# Patient Record
Sex: Male | Born: 1960
Health system: Southern US, Community
[De-identification: ages and names within clinical notes are randomized; demographics above are authoritative.]

## PROBLEM LIST (undated history)

## (undated) DIAGNOSIS — G709 Myoneural disorder, unspecified: Secondary | ICD-10-CM

## (undated) DIAGNOSIS — L0390 Cellulitis, unspecified: Secondary | ICD-10-CM

## (undated) DIAGNOSIS — G4733 Obstructive sleep apnea (adult) (pediatric): Secondary | ICD-10-CM

## (undated) DIAGNOSIS — F329 Major depressive disorder, single episode, unspecified: Secondary | ICD-10-CM

## (undated) DIAGNOSIS — B182 Chronic viral hepatitis C: Secondary | ICD-10-CM

## (undated) DIAGNOSIS — E1169 Type 2 diabetes mellitus with other specified complication: Secondary | ICD-10-CM

## (undated) DIAGNOSIS — M47812 Spondylosis without myelopathy or radiculopathy, cervical region: Secondary | ICD-10-CM

## (undated) DIAGNOSIS — J449 Chronic obstructive pulmonary disease, unspecified: Secondary | ICD-10-CM

## (undated) DIAGNOSIS — M47816 Spondylosis without myelopathy or radiculopathy, lumbar region: Secondary | ICD-10-CM

## (undated) DIAGNOSIS — I10 Essential (primary) hypertension: Secondary | ICD-10-CM

## (undated) DIAGNOSIS — Z860100 Personal history of colon polyps, unspecified: Secondary | ICD-10-CM

## (undated) DIAGNOSIS — R609 Edema, unspecified: Secondary | ICD-10-CM

## (undated) DIAGNOSIS — F909 Attention-deficit hyperactivity disorder, unspecified type: Secondary | ICD-10-CM

## (undated) DIAGNOSIS — Z8719 Personal history of other diseases of the digestive system: Secondary | ICD-10-CM

## (undated) DIAGNOSIS — F32A Depression, unspecified: Secondary | ICD-10-CM

## (undated) DIAGNOSIS — E230 Hypopituitarism: Secondary | ICD-10-CM

## (undated) DIAGNOSIS — G909 Disorder of the autonomic nervous system, unspecified: Secondary | ICD-10-CM

## (undated) DIAGNOSIS — G5601 Carpal tunnel syndrome, right upper limb: Secondary | ICD-10-CM

## (undated) DIAGNOSIS — E1140 Type 2 diabetes mellitus with diabetic neuropathy, unspecified: Secondary | ICD-10-CM

## (undated) DIAGNOSIS — F419 Anxiety disorder, unspecified: Secondary | ICD-10-CM

## (undated) DIAGNOSIS — K219 Gastro-esophageal reflux disease without esophagitis: Secondary | ICD-10-CM

## (undated) DIAGNOSIS — M1990 Unspecified osteoarthritis, unspecified site: Secondary | ICD-10-CM

## (undated) DIAGNOSIS — I878 Other specified disorders of veins: Secondary | ICD-10-CM

## (undated) DIAGNOSIS — G894 Chronic pain syndrome: Secondary | ICD-10-CM

## (undated) DIAGNOSIS — R600 Localized edema: Secondary | ICD-10-CM

## (undated) HISTORY — DX: Depression, unspecified: F32.A

## (undated) HISTORY — DX: Edema, unspecified: R60.9

## (undated) HISTORY — DX: Gastro-esophageal reflux disease without esophagitis: K21.9

## (undated) HISTORY — DX: Hypopituitarism: E23.0

## (undated) HISTORY — DX: Obstructive sleep apnea (adult) (pediatric): G47.33

## (undated) HISTORY — DX: Chronic pain syndrome: G89.4

## (undated) HISTORY — DX: Personal history of colonic polyps: Z86.010

## (undated) HISTORY — DX: Essential (primary) hypertension: I10

## (undated) HISTORY — DX: Other specified disorders of veins: I87.8

## (undated) HISTORY — DX: Attention-deficit hyperactivity disorder, unspecified type: F90.9

## (undated) HISTORY — DX: Type 2 diabetes mellitus with diabetic neuropathy, unspecified: E11.40

## (undated) HISTORY — DX: Carpal tunnel syndrome, right upper limb: G56.01

## (undated) HISTORY — DX: Cellulitis, unspecified: L03.90

## (undated) HISTORY — PX: UMBILICAL HERNIA REPAIR: SHX196

## (undated) HISTORY — DX: Hyperlipidemia, unspecified: E11.69

## (undated) HISTORY — PX: POSTERIOR LUMBAR FUSION: SHX6036

## (undated) HISTORY — PX: APPENDECTOMY: SHX54

## (undated) HISTORY — DX: Anxiety disorder, unspecified: F41.9

## (undated) HISTORY — DX: Localized edema: R60.0

## (undated) HISTORY — PX: SMALL INTESTINE SURGERY: SHX150

## (undated) HISTORY — DX: Chronic viral hepatitis C: B18.2

## (undated) HISTORY — DX: Personal history of colon polyps, unspecified: Z86.0100

## (undated) HISTORY — DX: Spondylosis without myelopathy or radiculopathy, lumbar region: M47.816

## (undated) HISTORY — DX: Personal history of other diseases of the digestive system: Z87.19

## (undated) HISTORY — DX: Myoneural disorder, unspecified: G70.9

## (undated) HISTORY — PX: OTHER SURGICAL HISTORY: SHX169

## (undated) HISTORY — DX: Chronic obstructive pulmonary disease, unspecified: J44.9

## (undated) HISTORY — DX: Disorder of the autonomic nervous system, unspecified: G90.9

## (undated) HISTORY — DX: Unspecified osteoarthritis, unspecified site: M19.90

## (undated) HISTORY — DX: Spondylosis without myelopathy or radiculopathy, cervical region: M47.812

---
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
## 2015-03-01 HISTORY — PX: OTHER SURGICAL HISTORY: SHX169

## 2016-03-04 DIAGNOSIS — B182 Chronic viral hepatitis C: Secondary | ICD-10-CM | POA: Insufficient documentation

## 2016-03-04 DIAGNOSIS — Z8719 Personal history of other diseases of the digestive system: Secondary | ICD-10-CM | POA: Insufficient documentation

## 2016-03-04 DIAGNOSIS — G909 Disorder of the autonomic nervous system, unspecified: Secondary | ICD-10-CM | POA: Insufficient documentation

## 2016-03-04 DIAGNOSIS — F17200 Nicotine dependence, unspecified, uncomplicated: Secondary | ICD-10-CM | POA: Insufficient documentation

## 2016-03-04 DIAGNOSIS — J449 Chronic obstructive pulmonary disease, unspecified: Secondary | ICD-10-CM | POA: Insufficient documentation

## 2016-03-04 DIAGNOSIS — I87302 Chronic venous hypertension (idiopathic) without complications of left lower extremity: Secondary | ICD-10-CM | POA: Insufficient documentation

## 2016-03-04 DIAGNOSIS — Z981 Arthrodesis status: Secondary | ICD-10-CM | POA: Insufficient documentation

## 2016-04-27 DIAGNOSIS — E559 Vitamin D deficiency, unspecified: Secondary | ICD-10-CM | POA: Insufficient documentation

## 2016-07-19 DIAGNOSIS — E1169 Type 2 diabetes mellitus with other specified complication: Secondary | ICD-10-CM | POA: Insufficient documentation

## 2016-07-19 DIAGNOSIS — E669 Obesity, unspecified: Secondary | ICD-10-CM | POA: Insufficient documentation

## 2016-08-11 DIAGNOSIS — Z860100 Personal history of colon polyps, unspecified: Secondary | ICD-10-CM | POA: Insufficient documentation

## 2016-08-11 DIAGNOSIS — K5730 Diverticulosis of large intestine without perforation or abscess without bleeding: Secondary | ICD-10-CM | POA: Insufficient documentation

## 2016-08-11 DIAGNOSIS — K429 Umbilical hernia without obstruction or gangrene: Secondary | ICD-10-CM | POA: Insufficient documentation

## 2016-08-11 DIAGNOSIS — I700 Atherosclerosis of aorta: Secondary | ICD-10-CM | POA: Insufficient documentation

## 2016-10-19 DIAGNOSIS — M47816 Spondylosis without myelopathy or radiculopathy, lumbar region: Secondary | ICD-10-CM | POA: Insufficient documentation

## 2017-02-01 DIAGNOSIS — M47812 Spondylosis without myelopathy or radiculopathy, cervical region: Secondary | ICD-10-CM | POA: Insufficient documentation

## 2017-04-03 DIAGNOSIS — E230 Hypopituitarism: Secondary | ICD-10-CM | POA: Insufficient documentation

## 2017-07-19 DIAGNOSIS — Z79891 Long term (current) use of opiate analgesic: Secondary | ICD-10-CM | POA: Insufficient documentation

## 2018-03-28 DIAGNOSIS — M5416 Radiculopathy, lumbar region: Secondary | ICD-10-CM | POA: Insufficient documentation

## 2018-03-28 DIAGNOSIS — M5126 Other intervertebral disc displacement, lumbar region: Secondary | ICD-10-CM | POA: Insufficient documentation

## 2018-03-28 DIAGNOSIS — M5127 Other intervertebral disc displacement, lumbosacral region: Secondary | ICD-10-CM | POA: Insufficient documentation

## 2019-01-21 ENCOUNTER — Ambulatory Visit: Payer: Self-pay

## 2019-01-23 ENCOUNTER — Ambulatory Visit: Payer: Medicare Other | Admitting: Physical Medicine and Rehabilitation

## 2019-01-23 ENCOUNTER — Encounter: Payer: Self-pay | Admitting: Physical Medicine and Rehabilitation

## 2019-01-23 ENCOUNTER — Other Ambulatory Visit: Payer: Self-pay

## 2019-01-23 VITALS — BP 144/87 | HR 64

## 2019-01-23 DIAGNOSIS — M546 Pain in thoracic spine: Secondary | ICD-10-CM

## 2019-01-23 DIAGNOSIS — M961 Postlaminectomy syndrome, not elsewhere classified: Secondary | ICD-10-CM

## 2019-01-23 DIAGNOSIS — L03119 Cellulitis of unspecified part of limb: Secondary | ICD-10-CM

## 2019-01-23 DIAGNOSIS — R519 Headache, unspecified: Secondary | ICD-10-CM

## 2019-01-23 DIAGNOSIS — E119 Type 2 diabetes mellitus without complications: Secondary | ICD-10-CM

## 2019-01-23 DIAGNOSIS — G894 Chronic pain syndrome: Secondary | ICD-10-CM | POA: Diagnosis not present

## 2019-01-23 DIAGNOSIS — I878 Other specified disorders of veins: Secondary | ICD-10-CM

## 2019-01-23 DIAGNOSIS — M542 Cervicalgia: Secondary | ICD-10-CM | POA: Diagnosis not present

## 2019-01-23 DIAGNOSIS — G8929 Other chronic pain: Secondary | ICD-10-CM

## 2019-01-23 DIAGNOSIS — M7918 Myalgia, other site: Secondary | ICD-10-CM

## 2019-01-23 DIAGNOSIS — I10 Essential (primary) hypertension: Secondary | ICD-10-CM

## 2019-01-23 MED ORDER — PREGABALIN 150 MG PO CAPS: 150.0000 mg | ORAL_CAPSULE | Freq: Two times a day (BID) | ORAL | 1 refills | Status: DC

## 2019-01-23 MED ORDER — CYCLOBENZAPRINE HCL 10 MG PO TABS: 10.0000 mg | ORAL_TABLET | Freq: Three times a day (TID) | ORAL | 2 refills | Status: DC | PRN

## 2019-01-23 NOTE — Progress Notes (Signed)
  Numeric Pain Rating Scale and Functional Assessment Average Pain 9 Pain Right Now 9 My pain is constant, sharp and aching Pain is worse with: sitting and lying in bed, lifting Pain improves with: medication   In the last MONTH (on 0-10 scale) has pain interfered with the following?  1. General activity like being  able to carry out your everyday physical activities such as walking, climbing stairs, carrying groceries, or moving a chair?  Rating(10)  2. Relation with others like being able to carry out your usual social activities and roles such as  activities at home, at work and in your community. Rating(10)  3. Enjoyment of life such that you have  been bothered by emotional problems such as feeling anxious, depressed or irritable?  Rating(10)

## 2019-01-28 ENCOUNTER — Ambulatory Visit (INDEPENDENT_AMBULATORY_CARE_PROVIDER_SITE_OTHER): Payer: Medicare Other | Admitting: Family Medicine

## 2019-01-28 DIAGNOSIS — I10 Essential (primary) hypertension: Secondary | ICD-10-CM | POA: Insufficient documentation

## 2019-01-28 DIAGNOSIS — I878 Other specified disorders of veins: Secondary | ICD-10-CM | POA: Insufficient documentation

## 2019-01-28 DIAGNOSIS — G894 Chronic pain syndrome: Secondary | ICD-10-CM | POA: Diagnosis not present

## 2019-01-28 DIAGNOSIS — L0390 Cellulitis, unspecified: Secondary | ICD-10-CM | POA: Insufficient documentation

## 2019-01-28 DIAGNOSIS — F339 Major depressive disorder, recurrent, unspecified: Secondary | ICD-10-CM | POA: Diagnosis not present

## 2019-01-28 DIAGNOSIS — Z79899 Other long term (current) drug therapy: Secondary | ICD-10-CM

## 2019-01-28 DIAGNOSIS — R519 Headache, unspecified: Secondary | ICD-10-CM | POA: Insufficient documentation

## 2019-01-28 DIAGNOSIS — M961 Postlaminectomy syndrome, not elsewhere classified: Secondary | ICD-10-CM | POA: Insufficient documentation

## 2019-01-28 DIAGNOSIS — M170 Bilateral primary osteoarthritis of knee: Secondary | ICD-10-CM | POA: Diagnosis not present

## 2019-01-28 DIAGNOSIS — M7918 Myalgia, other site: Secondary | ICD-10-CM | POA: Insufficient documentation

## 2019-01-28 DIAGNOSIS — E119 Type 2 diabetes mellitus without complications: Secondary | ICD-10-CM | POA: Diagnosis not present

## 2019-01-28 MED ORDER — VALSARTAN-HYDROCHLOROTHIAZIDE 160-25 MG PO TABS: 1.0000 | ORAL_TABLET | Freq: Every day | ORAL | 4 refills | Status: DC

## 2019-01-28 MED ORDER — METFORMIN HCL 500 MG PO TABS: 500.0000 mg | ORAL_TABLET | Freq: Two times a day (BID) | ORAL | 4 refills | Status: DC

## 2019-01-28 MED ORDER — BUSPIRONE HCL 10 MG PO TABS: 20.0000 mg | ORAL_TABLET | Freq: Two times a day (BID) | ORAL | 4 refills | Status: DC

## 2019-01-28 MED ORDER — SERTRALINE HCL 100 MG PO TABS: 100.0000 mg | ORAL_TABLET | Freq: Every day | ORAL | 4 refills | Status: DC

## 2019-01-28 NOTE — Progress Notes (Signed)
Virtual Visit via Telephone Note  I connected with Aaron Giles on 01/28/19 at  8:50 AM EST by telephone and verified that I am speaking with the correct person using two identifiers.   I discussed the limitations, risks, security and privacy concerns of performing an evaluation and management service by telephone and the availability of in person appointments. I also discussed with the patient that there may be a patient responsible charge related to this service. The patient expressed understanding and agreed to proceed.  Patient Location: Home Provider Location: [facility] Others participating in call: none   History of Present Illness:      58 yo male new to the practice.  He reports that he moved to the [place] area from [place] about 6 months ago and has not established with a primary care provider.  He reports that last week he saw an orthopedic doctor, Dr. Alvester MorinNewton regarding his chronic neck and back pain and patient additionally reports history of bilateral knee pain for which he has had injections in the past.  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  He reports a fracture at T9 as well as subsequent need to have lumbar fusion surgery.  He has had chronic pain since his fall.  He states that he is currently on disability secondary to his chronic pain issues.  Lumbar fusion surgery was done in 2012.  He received prescriptions for Lyrica and Flexeril from Dr. Alvester MorinNewton but states that Dr. Alvester MorinNewton suggested that patient be referred to pain management for ongoing treatment.  Patient states that he was established with pain management in [place] and in the past was on hydrocodone 10 mg 3 times daily for control of pain.  He currently takes over-the-counter Tylenol or ibuprofen but he  states that this does not touch his pain.  He chronically has pain between an 8-9 in his mid back/thoracic area as well as in the lower back.  Pain in his back is a burning and he has radiation of pain down his legs.  He also reports neck pain that is also around 8-9 with occasional radiation of pain into the shoulder blade areas.        He also reports a history of type 1 diabetes but states that this was only diagnosed a few years ago and he is only on metformin for control of his blood sugars. (Most likely this is actually type 2 diabetes).  He denies any current issues with increased thirst, blurred vision or urinary frequency related to his diabetes.  He reports that he has not yet unpacked his glucometer therefore he has been unable to check his blood sugars.  He also has hypertension for which he takes valsartan-hydrochlorothiazide.  He has not been checking his blood pressures.  He denies headaches or dizziness related to his blood pressure but does get headaches at the base of the scalp due to his chronic neck pain.  He also reports a history of venous stasis in his lower extremities and has had past hospitalization for cellulitis.  He denies any current cellulitis but does have some mild, chronic lower extremity swelling.  He reports chronic depression which is currently controlled on BuSpar for which he reports being on 20 mg twice daily as well  as 100 mg daily of Zoloft.  No suicidal thoughts or ideations, no intent for self-harm.  He also reports bilateral knee pain for which he is received injections in the past.  He does not currently use any assistive devices for ambulation but has used a cane in the past.   Past Medical History:  Diagnosis Date  . Arthritis   . Carpal tunnel syndrome on right   . Cellulitis   . Diabetes mellitus without complication ([place])   . Hypertension   . Venous stasis     Past Surgical History:  Procedure Laterality Date  . POSTERIOR LUMBAR FUSION     L3-4  fusion    No family history on file.  Social History   Tobacco Use  . Smoking status: Current Every Day Smoker    Packs/day: 0.50  . Smokeless tobacco: Never Used  Substance Use Topics  . Alcohol use: Not Currently  . Drug use: Not on file     No Known Allergies     Observations/Objective: No vital signs or physical exam conducted as visit was done via telephone  Assessment and Plan: 1. Type 2 diabetes mellitus without complication, without long-term current use of insulin (HCC) On review of chart, there are no prior lab values available.  He has been asked to come into the office to have hemoglobin A1c as he states that he has not had this done since moving here to [place] 6 months ago.  He will also have comprehensive metabolic panel and lipid panel as well as urine microalbumin in follow-up of his diabetes.  He reports that his blood sugars are currently controlled on his Metformin.  He will be contacted regarding any needed changes in medication based on his upcoming lab information.  He is encouraged to continue low carbohydrate diet as well as exercise as tolerated. - metFORMIN (GLUCOPHAGE) 500 MG tablet; Take 1 tablet (500 mg total) by mouth 2 (two) times daily with a meal.  Dispense: 60 tablet; Refill: 4 - Comprehensive metabolic panel; Future - Lipid Panel; Future - Hemoglobin A1c; Future  2. Essential hypertension He reports his blood pressure has been controlled on his current valsartan-hydrochlorothiazide and refill provided at today's visit.  He has been asked to have nurse visit for blood pressure recheck when he comes in for his lab visit. - valsartan-hydrochlorothiazide (DIOVAN-HCT) 160-25 MG tablet; Take 1 tablet by mouth daily. To lower blood pressure  Dispense: 30 tablet; Refill: 4 - Lipid Panel; Future  3. Chronic pain syndrome 4. Post laminectomy syndrome Recent note from patient's appointment with Dr. Ernestina Patches on January 23, 2019 reviewed and Dr. Ernestina Patches  does recommend referral to pain management clinic.  Referral will be placed for ongoing treatment of patient's chronic issues with back pain and neck pain.  Patient reports that he does have a disc containing his past medical records. - Ambulatory referral to Pain Clinic  5. Major depression, recurrent, chronic (HCC)-controlled; currently mild Patient reports history of major depression related to his chronic pain/chronic injuries.  He reports that his symptoms are currently controlled with the use of BuSpar and Zoloft and medications were refilled at today's visit. - busPIRone (BUSPAR) 10 MG tablet; Take 2 tablets (20 mg total) by mouth 2 (two) times daily.  Dispense: 120 tablet; Refill: 4 - sertraline (ZOLOFT) 100 MG tablet; Take 1 tablet (100 mg total) by mouth daily.  Dispense: 30 tablet; Refill: 4 - Comprehensive metabolic panel; Future  6. Long-term use of high-risk medication Patient  with long-term use of high-risk medications for treatment of chronic pain issues, hypertension, diabetes and depression.  Patient will come into the office tomorrow to have comprehensive metabolic panel. - Comprehensive metabolic panel; Future  7. Osteoarthritis of both knees, unspecified osteoarthritis type Discussed with patient that he can contact the orthopedic office where he was recently seen in order to obtain follow-up of his osteoarthritis and injections can also be given if needed by pain medicine to which he has been referred.  Follow Up Instructions:Return in about 4 months (around 05/28/2019) for Chronic issues; lab visit and blood pressure recheck in 1 to 2 weeks.    I discussed the assessment and treatment plan with the patient. The patient was provided an opportunity to ask questions and all were answered. The patient agreed with the plan and demonstrated an understanding of the instructions.   The patient was advised to call back or seek an in-person evaluation if the symptoms worsen or if the  condition fails to improve as anticipated.  I provided 24 minutes of non-face-to-face time during this encounter.   Cain Saupe, MD

## 2019-01-28 NOTE — Progress Notes (Signed)
Aaron Giles - 58 y.o. male MRN [id number]  Date of birth: [birth year]  Office Visit Note: Visit Date: 01/23/2019 PCP: Herby Abraham, MD Referred by: No ref. provider found  Subjective: Chief Complaint  Patient presents with   Neck - Pain   Middle Back - Pain   Lower Back - Pain   Right Leg - Pain   HPI: Aaron Giles is a 58 y.o. male who comes in today As a self referral for evaluation and management of neck and back pain and leg pain status post lumbar fusion and chronic pain syndrome with a history of medication management with long-term opioid.  He reports moving to [place] 5 to 6 months ago from [place] where he was treated through the [facility] system.  He did bring a CD today with over 500 pages of notes with the last set of notes ending last year.  I did review most of the notes including his history of spine surgery and pain management.  When asked about his history he gives a rather complicated history focusing on cause-and-effect and injury.  Essentially he feels like this all stemmed from an incident where he had issues with the T9 vertebral body that he reports is a fracture.  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  He is having pain from this generalized thoracic area referring into the neck and causing headaches in general.  This is his main complaint at this point.  This pain is still been going on now for several years.  None of his pain is acute it is all chronic.  He does get pain from the shoulder blade areas radiating in a nondermatomal fashion and more of a myotomal fashion up through the trapezius and occiput.  He describes occipital headache but without any characteristics of occipital neuralgia.  He has not seen a headache specialist.  He has not reported any new focal weakness or bowel or bladder issues or red flag complaints.  His second biggest complaint  today is trying to figure out how he can be restarted on a comprehensive medication approach to his pain which has been successful according to him.  In terms of his other complaints today he reports constant sharp aching low back pain worse with sitting but does radiate into the right leg.  He reports lumbar fusion at L3-4 but left him with permanent nerve damage.  The surgery was performed in 2012.  He reports paresthesias in the right leg more of an L5 distribution.  Classic pain down to the big toe.  He reports multiple interventions with injections with no relief.  He also reports that spinal cord stimulator was presented as an option but he does not want any devices.  He does not want any more surgery.  Even though he talks about permanent nerve damage she has not had electrodiagnostic study that he is aware of and I did describe this to him.  I did not see this in the 500 pages of notes.  His pain is worse with prolonged sitting but also lying in bed.  He is reported for the last 4 years she has slept in a chair.  His course is complicated by morbid obesity, type 2 diabetes as well as an every day smoking history.  He also reports chronic venous stasis and cellulitis.  He has not established a primary care physician here  as of yet.  He tells me that he has an appointment with Dr. Bing Quarry coming up.  Medications that he is used for his pain including Flexeril and Lyrica.  The Lyrica was 150 mg twice a day.  He also used hydrocodone.  I did check the controlled substance reporting and the last hydrocodone prescription was in May of last year.  He was receiving 84 tablets/month of hydrocodone 10 mg.  Prior to that he received oxycodone.  He was receiving these part of a comprehensive pain management program and at least according to their notes had no aberrant usage.  Review of Systems  Constitutional: Negative for chills, fever, malaise/fatigue and weight loss.  HENT: Negative for hearing loss and  sinus pain.   Eyes: Negative for blurred vision, double vision and photophobia.  Respiratory: Negative for cough and shortness of breath.   Cardiovascular: Negative for chest pain, palpitations and leg swelling.  Gastrointestinal: Negative for abdominal pain, nausea and vomiting.  Genitourinary: Negative for flank pain.  Musculoskeletal: Positive for back pain, joint pain and neck pain. Negative for myalgias.  Skin: Negative for itching and rash.  Neurological: Positive for tingling, focal weakness and weakness. Negative for tremors.  Endo/Heme/Allergies: Negative.   Psychiatric/Behavioral: Negative for depression. The patient is nervous/anxious.   All other systems reviewed and are negative.  Otherwise per HPI.  Assessment & Plan: Visit Diagnoses:  1. Chronic pain syndrome   2. Post laminectomy syndrome   3. Chronic bilateral thoracic back pain   4. Cervicalgia   5. Myofascial pain syndrome   6. Occipital headache   7. Venous stasis   8. Essential hypertension   9. Diabetes mellitus without complication ([place])   10. Cellulitis of lower extremity, unspecified laterality     Plan: Findings:  Complicated chronic pain history with a combination of prior lumbar fusion with some question of nerve damage and continued right radicular leg pain in the setting also of myofascial pain syndrome with myotomal referral patterns up into the neck and shoulder blades with occipital headache.  He is really not interested in any intervention at this point as he says he is really tried everything.  He was maintained on good control through physicians in [place] in [place].  He does have all these notes that do show his history.  At least on the controlled substance reports has not had opioid medication since last year.  I did encourage him that if he could control his pain without that it would be better for him but obviously this is something he has had for a long time and is very anxious  about returning to some level of function which I understand.  We have talked about the fact that in our office we just cannot handle many patients with chest medication prescription.  He is scheduled to see a primary care physician soon Dr. Lia Foyer.  I will make sure the note gets to him.  I did provide a prescription today for Lyrica as well as Flexeril.  He would likely be served well with pain psychology and coping strategies even though this is hard to do in most cases.  The following is a list of pain management providers that I think would be the best attempt at trying to help him.  Good pain management providers in [facility] with comprehensive management.   1. [facility]: Alysia Penna, MD, Alger Simons, MD and Ankit Patel,MD  2. [facility]: Elta Guadeloupe  Vear ClockPhillips, MD and Verdon CumminsMarioara Bodea, MD  3. [facility]: all locations  4. Ewing SchleinGregory Crisp, MD [facility]    Meds & Orders:  Meds ordered this encounter  Medications   pregabalin (LYRICA) 150 MG capsule    Sig: Take 1 capsule (150 mg total) by mouth 2 (two) times daily.    Dispense:  60 capsule    Refill:  1   cyclobenzaprine (FLEXERIL) 10 MG tablet    Sig: Take 1 tablet (10 mg total) by mouth 3 (three) times daily as needed for muscle spasms.    Dispense:  30 tablet    Refill:  2   No orders of the defined types were placed in this encounter.   Follow-up: Return if symptoms worsen or fail to improve.   Procedures: No procedures performed  No notes on file   Clinical History: No specialty comments available.   He reports that he has been smoking. He has been smoking about 0.50 packs per day. He has never used smokeless tobacco. No results for input(s): HGBA1C, LABURIC in the last 8760 hours.  Objective:  VS:  HT:     WT:    BMI:      BP:(!) 144/87   HR:64bpm   TEMP: ( )   RESP:  Physical Exam Vitals signs and nursing note reviewed.  Constitutional:       General: He is not in acute distress.    Appearance: He is well-developed. He is obese.  HENT:     Head: Normocephalic and atraumatic.     Nose: Nose normal.     Mouth/Throat:     Mouth: Mucous membranes are moist.     Pharynx: Oropharynx is clear.  Eyes:     Conjunctiva/sclera: Conjunctivae normal.     Pupils: Pupils are equal, round, and reactive to light.  Neck:     Musculoskeletal: Neck supple. Muscular tenderness present. No neck rigidity.     Trachea: No tracheal deviation.  Cardiovascular:     Rate and Rhythm: Normal rate and regular rhythm.     Pulses: Normal pulses.  Pulmonary:     Effort: Pulmonary effort is normal.     Breath sounds: Normal breath sounds.  Abdominal:     General: There is no distension.     Palpations: Abdomen is soft.     Tenderness: There is no guarding or rebound.  Musculoskeletal:        General: Tenderness present. No deformity.     Right lower leg: Edema present.     Left lower leg: Edema present.     Comments: Cervical spine evaluation shows that the patient has crepitus with rotation and lateral bending.  He has focal trigger points in the trapezius levator scapula and rhomboids bilaterally left more than right.  He has shoulder impingement on both sides.  Could have some level of capsulitis.  He has good strength in the upper extremities bilaterally he has a negative Hoffmann's test bilaterally and a negative Spurling's test bilaterally.  He is very slow to rise from a seated position.  He ambulates with a somewhat antalgic gait to the right.  He does have decreased sensation in L5 dermatome on the right some weakness with EHL.  He has no pain with hip rotation.  He is very stiff throughout his whole lower back and lumbar spine.  He has some localized tenderness even with light palpation.  He has no clonus bilaterally  Skin:    General: Skin is warm  and dry.     Findings: Erythema present. No lesion or rash.  Neurological:     Mental Status: He is  alert and oriented to person, place, and time.     Cranial Nerves: No cranial nerve deficit.     Sensory: Sensory deficit present.     Motor: Weakness present. No abnormal muscle tone.     Coordination: Coordination normal.     Gait: Gait abnormal.  Psychiatric:        Mood and Affect: Mood normal.        Behavior: Behavior normal.        Thought Content: Thought content normal.     Ortho Exam Imaging: No results found.  Past Medical/Family/Surgical/Social History: Medications & Allergies reviewed per EMR, new medications updated. Patient Active Problem List   Diagnosis Date Noted   Occipital headache 01/28/2019   Myofascial pain syndrome 01/28/2019   Post laminectomy syndrome 01/28/2019   Chronic pain syndrome 01/28/2019   Venous stasis    Hypertension    Diabetes mellitus without complication (HCC)    Cellulitis    Past Medical History:  Diagnosis Date   Arthritis    Carpal tunnel syndrome on right    Cellulitis    Diabetes mellitus without complication (HCC)    Hypertension    Venous stasis    History reviewed. No pertinent family history. Past Surgical History:  Procedure Laterality Date   POSTERIOR LUMBAR FUSION     L3-4 fusion   Social History   Occupational History   Not on file  Tobacco Use   Smoking status: Current Every Day Smoker    Packs/day: 0.50   Smokeless tobacco: Never Used  Substance and Sexual Activity   Alcohol use: Not Currently   Drug use: Not on file   Sexual activity: Not on file

## 2019-01-29 ENCOUNTER — Encounter: Payer: Self-pay | Admitting: Family Medicine

## 2019-01-29 ENCOUNTER — Other Ambulatory Visit: Payer: Self-pay

## 2019-01-29 ENCOUNTER — Ambulatory Visit (INDEPENDENT_AMBULATORY_CARE_PROVIDER_SITE_OTHER): Payer: Medicare Other

## 2019-01-29 ENCOUNTER — Other Ambulatory Visit: Payer: Self-pay | Admitting: Family Medicine

## 2019-01-29 DIAGNOSIS — F339 Major depressive disorder, recurrent, unspecified: Secondary | ICD-10-CM

## 2019-01-29 DIAGNOSIS — E119 Type 2 diabetes mellitus without complications: Secondary | ICD-10-CM | POA: Diagnosis not present

## 2019-01-29 DIAGNOSIS — I10 Essential (primary) hypertension: Secondary | ICD-10-CM | POA: Diagnosis not present

## 2019-01-29 DIAGNOSIS — E237 Disorder of pituitary gland, unspecified: Secondary | ICD-10-CM

## 2019-01-29 DIAGNOSIS — Z79899 Other long term (current) drug therapy: Secondary | ICD-10-CM

## 2019-01-29 NOTE — Progress Notes (Signed)
Patient ID: Aaron Giles, male   DOB: [birth year], 58 y.o.   MRN: [id number]   58 year old male new to the practice.  Patient left message through MyChart that he has been diagnosed with a pituitary nodule which has caused hormonal imbalances and he is currently taking testosterone.  Patient is new to the area as he only recently moved to the state about 6 months ago.  He will be referred to an endocrinologist for further evaluation of his endocrine/pituitary issues.

## 2019-01-29 NOTE — Progress Notes (Signed)
Patient here for fasting labs & BP check.

## 2019-01-30 ENCOUNTER — Encounter: Payer: Self-pay | Admitting: Physical Medicine and Rehabilitation

## 2019-01-30 LAB — COMPREHENSIVE METABOLIC PANEL WITH GFR
ALT: 36 IU/L (ref 0–44)
AST: 28 IU/L (ref 0–40)
Albumin/Globulin Ratio: 1.7 (ref 1.2–2.2)
Albumin: 4.5 g/dL (ref 3.8–4.9)
Alkaline Phosphatase: 66 IU/L (ref 39–117)
BUN/Creatinine Ratio: 19 (ref 9–20)
BUN: 15 mg/dL (ref 6–24)
Bilirubin Total: <0.2 mg/dL (ref 0.0–1.2)
CO2: 26 mmol/L (ref 20–29)
Calcium: 9 mg/dL (ref 8.7–10.2)
Chloride: 105 mmol/L (ref 96–106)
Creatinine, Ser: 0.8 mg/dL (ref 0.76–1.27)
GFR calc Af Amer: 115 mL/min/1.73
GFR calc non Af Amer: 99 mL/min/1.73
Globulin, Total: 2.6 g/dL (ref 1.5–4.5)
Glucose: 117 mg/dL — ABNORMAL HIGH (ref 65–99)
Potassium: 4.3 mmol/L (ref 3.5–5.2)
Sodium: 146 mmol/L — ABNORMAL HIGH (ref 134–144)
Total Protein: 7.1 g/dL (ref 6.0–8.5)

## 2019-01-30 LAB — HEMOGLOBIN A1C
Est. average glucose Bld gHb Est-mCnc: 171 mg/dL
Hgb A1c MFr Bld: 7.6 % — ABNORMAL HIGH (ref 4.8–5.6)

## 2019-01-30 LAB — LIPID PANEL
Chol/HDL Ratio: 4.3 ratio (ref 0.0–5.0)
Cholesterol, Total: 194 mg/dL (ref 100–199)
HDL: 45 mg/dL
LDL Chol Calc (NIH): 109 mg/dL — ABNORMAL HIGH (ref 0–99)
Triglycerides: 232 mg/dL — ABNORMAL HIGH (ref 0–149)
VLDL Cholesterol Cal: 40 mg/dL (ref 5–40)

## 2019-01-30 LAB — MICROALBUMIN / CREATININE URINE RATIO
Creatinine, Urine: 84.1 mg/dL
Microalb/Creat Ratio: 11 mg/g{creat} (ref 0–29)
Microalbumin, Urine: 9.2 ug/mL

## 2019-01-31 ENCOUNTER — Encounter: Payer: Self-pay | Admitting: Family Medicine

## 2019-02-01 ENCOUNTER — Encounter: Payer: Self-pay | Admitting: Family Medicine

## 2019-02-04 ENCOUNTER — Encounter: Payer: Self-pay | Admitting: Family Medicine

## 2019-02-05 ENCOUNTER — Encounter: Payer: Self-pay | Admitting: Family Medicine

## 2019-02-05 ENCOUNTER — Ambulatory Visit: Payer: Medicare Other | Admitting: Family Medicine

## 2019-02-05 ENCOUNTER — Other Ambulatory Visit: Payer: Self-pay

## 2019-02-05 DIAGNOSIS — M25562 Pain in left knee: Secondary | ICD-10-CM | POA: Diagnosis not present

## 2019-02-05 DIAGNOSIS — G8929 Other chronic pain: Secondary | ICD-10-CM | POA: Diagnosis not present

## 2019-02-05 MED ORDER — MELOXICAM 15 MG PO TABS: 7.5000 mg | ORAL_TABLET | Freq: Every day | ORAL | 6 refills | Status: DC | PRN

## 2019-02-05 NOTE — Progress Notes (Signed)
I saw and examined the patient with Dr. Mayer Masker and agree with assessment and plan as outlined.    Left knee injected again today.  Will order MRI if this doesn't help.  Meloxicam Rx given.

## 2019-02-05 NOTE — Progress Notes (Signed)
Aaron Giles - 58 y.o. male MRN [id number]  Date of birth: 1960-07-07  Office Visit Note: Visit Date: 02/05/2019 PCP: Antony Blackbird, MD Referred by: Antony Blackbird, MD  Subjective: Chief Complaint  Patient presents with  . Left Knee - Pain    H/o bilateral cortisone injections in March of this year. No relief with this in the left knee. Constant stabbing pain around the patella. Popping. No swelling.   HPI: Aaron Giles is a 58 y.o. male who comes in today with left knee pain.   He reports that he has had chronic bilateral knee pain for years. He had both knees injected at [facility] in March. Right knee injection provided good relief and he is still without pain. Left knee had relief for 2 weeks but then started hurting again. He describes pain around patellar and also at medial knee joint. He has locking, catching of knee and feels popping at this medial area with significant pain. His knee has also been giving out. Has been taking ibuprofen and tylenol with minimal relief.    ROS Otherwise per HPI.  Assessment & Plan: Visit Diagnoses:  1. Chronic pain of left knee     Plan: Symptoms and exam concerning for menisical pathology as well as underlying OA of knee. He elected for corticosteroid injection and will do conservative measures of NSAIDs and compression sleeve for 1 month. If no improvement, will order MRI.   Meds & Orders:  Meds ordered this encounter  Medications  . meloxicam (MOBIC) 15 MG tablet    Sig: Take 0.5-1 tablets (7.5-15 mg total) by mouth daily as needed for pain.    Dispense:  30 tablet    Refill:  6   No orders of the defined types were placed in this encounter.   Follow-up: PRN  Procedures: No procedures performed  No notes on file   Clinical History: No specialty comments available.   He reports that he has been smoking. He has been smoking about 0.50 packs per day. He has never used smokeless tobacco.  Recent Labs    01/29/19 0945   HGBA1C 7.6*    Objective:  VS:  HT:    WT:   BMI:     BP:   HR: bpm  TEMP: ( )  RESP:  Physical Exam  PHYSICAL EXAM: Gen: NAD, alert, cooperative with exam, well-appearing HEENT: clear conjunctiva,  CV:  no edema, capillary refill brisk, normal rate Resp: non-labored Skin: venous stasis bilaterally Neuro: no gross deficits.  Psych:  alert and oriented  Ortho Exam  Left Knee: - Inspection: no gross deformity. Mild effusion. No erythema or bruising. Skin intact - Palpation: TTP over medial joint line and medial and lateral to patellar tendon - ROM: full active ROM with flexion and extension in knee and hip. Crepitus noted. - Strength: 5/5 strength - Neuro/vasc: NV intact - Special Tests: - LIGAMENTS: negative anterior and posterior drawer, no MCL or LCL laxity  -- MENISCUS: positive McMurray's -- PF JOINT: nml patellar mobility bilaterally.  negative patellar grind  Hips: normal ROM, negative FABER and FADIR bilaterally Imaging: No results found.  Past Medical/Family/Surgical/Social History: Medications & Allergies reviewed per EMR, new medications updated. Patient Active Problem List   Diagnosis Date Noted  . Occipital headache 01/28/2019  . Myofascial pain syndrome 01/28/2019  . Post laminectomy syndrome 01/28/2019  . Chronic pain syndrome 01/28/2019  . Venous stasis   . Hypertension   . Diabetes mellitus without complication ([place])   .  Cellulitis    Past Medical History:  Diagnosis Date  . ADHD   . Anxiety   . Arthritis   . Carpal tunnel syndrome on right   . Cellulitis   . Cervical spondylosis   . Chronic hepatitis C (HCC)   . Chronic pain syndrome   . COPD (chronic obstructive pulmonary disease) (HCC)   . Depression   . Dyslipidemia associated with type 2 diabetes mellitus (HCC)   . GERD (gastroesophageal reflux disease)   . History of colonic polyps   . History of umbilical hernia   . Hypertension   . Hypogonadotropic hypogonadism (HCC)   .  Lumbar spondylosis   . Neuromuscular disorder (HCC)   . OSA (obstructive sleep apnea)   . Peripheral autonomic neuropathy   . Peripheral edema   . Type 2 diabetes mellitus with diabetic neuropathy (HCC)   . Venous stasis    Family History  Adopted: Yes   Past Surgical History:  Procedure Laterality Date  . APPENDECTOMY    . groin abscess Right 2017  . POSTERIOR LUMBAR FUSION     L3-4 fusion  . removal of cyst from testicle Right   . SMALL INTESTINE SURGERY    . UMBILICAL HERNIA REPAIR     Social History   Occupational History  . Not on file  Tobacco Use  . Smoking status: Current Every Day Smoker    Packs/day: 0.50  . Smokeless tobacco: Never Used  Substance and Sexual Activity  . Alcohol use: Not Currently  . Drug use: Not on file  . Sexual activity: Not on file

## 2019-02-10 ENCOUNTER — Encounter: Payer: Self-pay | Admitting: Family Medicine

## 2019-02-11 MED ORDER — NABUMETONE 750 MG PO TABS: 750.0000 mg | ORAL_TABLET | Freq: Two times a day (BID) | ORAL | 6 refills | Status: DC | PRN

## 2019-02-15 ENCOUNTER — Other Ambulatory Visit: Payer: Self-pay | Admitting: Physical Medicine and Rehabilitation

## 2019-02-15 ENCOUNTER — Encounter: Payer: Self-pay | Admitting: Physical Medicine and Rehabilitation

## 2019-02-15 NOTE — Telephone Encounter (Signed)
Please Advise

## 2019-02-18 ENCOUNTER — Other Ambulatory Visit: Payer: Self-pay | Admitting: Physical Medicine and Rehabilitation

## 2019-02-18 DIAGNOSIS — E1140 Type 2 diabetes mellitus with diabetic neuropathy, unspecified: Secondary | ICD-10-CM | POA: Diagnosis not present

## 2019-02-18 DIAGNOSIS — G894 Chronic pain syndrome: Secondary | ICD-10-CM | POA: Diagnosis not present

## 2019-02-18 DIAGNOSIS — I878 Other specified disorders of veins: Secondary | ICD-10-CM | POA: Diagnosis not present

## 2019-02-18 DIAGNOSIS — M5030 Other cervical disc degeneration, unspecified cervical region: Secondary | ICD-10-CM | POA: Diagnosis not present

## 2019-02-18 DIAGNOSIS — M4326 Fusion of spine, lumbar region: Secondary | ICD-10-CM | POA: Diagnosis not present

## 2019-02-18 DIAGNOSIS — J449 Chronic obstructive pulmonary disease, unspecified: Secondary | ICD-10-CM | POA: Diagnosis not present

## 2019-02-18 DIAGNOSIS — M5136 Other intervertebral disc degeneration, lumbar region: Secondary | ICD-10-CM | POA: Diagnosis not present

## 2019-02-19 ENCOUNTER — Other Ambulatory Visit: Payer: Self-pay | Admitting: Family Medicine

## 2019-02-19 DIAGNOSIS — E785 Hyperlipidemia, unspecified: Secondary | ICD-10-CM

## 2019-02-19 MED ORDER — ROSUVASTATIN CALCIUM 10 MG PO TABS: 10.0000 mg | ORAL_TABLET | Freq: Every day | ORAL | 5 refills | Status: DC

## 2019-02-19 NOTE — Telephone Encounter (Signed)
Called pharmacy and left message to advise.

## 2019-02-19 NOTE — Progress Notes (Signed)
Patient ID: Aaron Giles, male   DOB: 14-Jun-1960, 58 y.o.   MRN: [id number]   Patient with elevated TG and elevated LDL on recent lipid panel and patient left message that he forgot to mention that he was on Crestor in the past. New RX sent to his pharmacy for Crestor 10 mg.

## 2019-02-19 NOTE — Telephone Encounter (Signed)
Sorry, please pharmacy, pt had refill on first Rx, that can be refilled, please do not start new rx or fill two!  I tried to just accept "one" electronically and not able to do or figure out how

## 2019-02-19 NOTE — Telephone Encounter (Signed)
Please advise 

## 2019-03-12 ENCOUNTER — Other Ambulatory Visit: Payer: Self-pay

## 2019-03-14 ENCOUNTER — Ambulatory Visit: Payer: Medicare Other | Admitting: Endocrinology

## 2019-03-14 ENCOUNTER — Other Ambulatory Visit: Payer: Self-pay

## 2019-03-14 ENCOUNTER — Encounter: Payer: Self-pay | Admitting: Endocrinology

## 2019-03-14 DIAGNOSIS — G4730 Sleep apnea, unspecified: Secondary | ICD-10-CM

## 2019-03-14 DIAGNOSIS — G4733 Obstructive sleep apnea (adult) (pediatric): Secondary | ICD-10-CM | POA: Insufficient documentation

## 2019-03-14 DIAGNOSIS — R7989 Other specified abnormal findings of blood chemistry: Secondary | ICD-10-CM | POA: Diagnosis not present

## 2019-03-14 DIAGNOSIS — Z125 Encounter for screening for malignant neoplasm of prostate: Secondary | ICD-10-CM

## 2019-03-14 LAB — CBC WITH DIFFERENTIAL/PLATELET
Basophils Absolute: 0 10*3/uL (ref 0.0–0.1)
Basophils Relative: 0.4 % (ref 0.0–3.0)
Eosinophils Absolute: 0 10*3/uL (ref 0.0–0.7)
Eosinophils Relative: 0.8 % (ref 0.0–5.0)
HCT: 41.6 % (ref 39.0–52.0)
Hemoglobin: 13.9 g/dL (ref 13.0–17.0)
Lymphocytes Relative: 22.3 % (ref 12.0–46.0)
Lymphs Abs: 1.2 10*3/uL (ref 0.7–4.0)
MCHC: 33.5 g/dL (ref 30.0–36.0)
MCV: 94.8 fl (ref 78.0–100.0)
Monocytes Absolute: 0.5 10*3/uL (ref 0.1–1.0)
Monocytes Relative: 9.2 % (ref 3.0–12.0)
Neutro Abs: 3.8 10*3/uL (ref 1.4–7.7)
Neutrophils Relative %: 67.3 % (ref 43.0–77.0)
Platelets: 163 10*3/uL (ref 150.0–400.0)
RBC: 4.38 Mil/uL (ref 4.22–5.81)
RDW: 12.5 % (ref 11.5–15.5)
WBC: 5.6 10*3/uL (ref 4.0–10.5)

## 2019-03-14 LAB — PSA: PSA: 0.86 ng/mL (ref 0.10–4.00)

## 2019-03-14 LAB — TSH: TSH: 1.06 u[IU]/mL (ref 0.35–4.50)

## 2019-03-14 LAB — LUTEINIZING HORMONE: LH: 6.62 m[IU]/mL (ref 1.50–9.30)

## 2019-03-14 LAB — T4, FREE: Free T4: 0.77 ng/dL (ref 0.60–1.60)

## 2019-03-14 LAB — FOLLICLE STIMULATING HORMONE: FSH: 12.4 m[IU]/mL (ref 1.4–18.1)

## 2019-03-14 NOTE — Patient Instructions (Addendum)
Blood tests are requested for you today.  We'll let you know about the results.   Testosterone treatment has risks, including increased or decreased fertility (depending on the type of treatment), hair loss, prostate cancer, benign prostate enlargement, blood clots, liver problems, lower hdl ("good cholesterol"), polycythemia (opposite of anemia), sleep apnea, and behavior changes.   Weight loss also helps the testosterone.   Based on the results, I may be able to prescribe for you a pill for this.

## 2019-03-14 NOTE — Progress Notes (Signed)
Subjective:    Patient ID: Aaron Giles, male    DOB: 06-10-1960, 59 y.o.   MRN: [id number]  HPI Pt is referred by Dr Jillyn Hidden, for hypogonadism.  Pt reports he had puberty at the normal age.  He has 4 biological children.  He says he has never taken illicit androgens.  He denies any h/o infertility, XRT, or genital infection.  He has never had surgery, or a serious injury to the head. He has no h/o sleep apnea or DVT.   Pt was noted to have a 1 mm pituitary nodule in 2018 [facility] [place], [place]).  He was dx'ed with low testosterone, and rx'ed with testosterone injections, which he took until early 2019.  These helped sxs.   Past Medical History:  Diagnosis Date  . ADHD   . Anxiety   . Arthritis   . Carpal tunnel syndrome on right   . Cellulitis   . Cervical spondylosis   . Chronic hepatitis C (HCC)   . Chronic pain syndrome   . COPD (chronic obstructive pulmonary disease) (HCC)   . Depression   . Dyslipidemia associated with type 2 diabetes mellitus (HCC)   . GERD (gastroesophageal reflux disease)   . History of colonic polyps   . History of umbilical hernia   . Hypertension   . Hypogonadotropic hypogonadism (HCC)   . Lumbar spondylosis   . Neuromuscular disorder (HCC)   . OSA (obstructive sleep apnea)   . Peripheral autonomic neuropathy   . Peripheral edema   . Type 2 diabetes mellitus with diabetic neuropathy (HCC)   . Venous stasis     Past Surgical History:  Procedure Laterality Date  . APPENDECTOMY    . groin abscess Right 2017  . POSTERIOR LUMBAR FUSION     L3-4 fusion  . removal of cyst from testicle Right   . SMALL INTESTINE SURGERY    . UMBILICAL HERNIA REPAIR      Social History   Socioeconomic History  . Marital status: Divorced    Spouse name: Not on file  . Number of children: Not on file  . Years of education: Not on file  . Highest education level: Not on file  Occupational History  . Not on file  Tobacco Use  . Smoking status: Current  Every Day Smoker    Packs/day: 0.50  . Smokeless tobacco: Never Used  Substance and Sexual Activity  . Alcohol use: Not Currently  . Drug use: Not on file  . Sexual activity: Not on file  Other Topics Concern  . Not on file  Social History Narrative  . Not on file   Social Determinants of Health   Financial Resource Strain:   . Difficulty of Paying Living Expenses: Not on file  Food Insecurity:   . Worried About Programme researcher, broadcasting/film/video in the Last Year: Not on file  . Ran Out of Food in the Last Year: Not on file  Transportation Needs:   . Lack of Transportation (Medical): Not on file  . Lack of Transportation (Non-Medical): Not on file  Physical Activity:   . Days of Exercise per Week: Not on file  . Minutes of Exercise per Session: Not on file  Stress:   . Feeling of Stress : Not on file  Social Connections:   . Frequency of Communication with Friends and Family: Not on file  . Frequency of Social Gatherings with Friends and Family: Not on file  . Attends Religious Services: Not on  file  . Active Member of Clubs or Organizations: Not on file  . Attends Banker Meetings: Not on file  . Marital Status: Not on file  Intimate Partner Violence:   . Fear of Current or Ex-Partner: Not on file  . Emotionally Abused: Not on file  . Physically Abused: Not on file  . Sexually Abused: Not on file    Current Outpatient Medications on File Prior to Visit  Medication Sig Dispense Refill  . busPIRone (BUSPAR) 10 MG tablet Take 2 tablets (20 mg total) by mouth 2 (two) times daily. 120 tablet 4  . cyclobenzaprine (FLEXERIL) 10 MG tablet TAKE 1 TABLET(10 MG) BY MOUTH THREE TIMES DAILY AS NEEDED FOR MUSCLE SPASMS 30 tablet 2  . meloxicam (MOBIC) 15 MG tablet Take 0.5-1 tablets (7.5-15 mg total) by mouth daily as needed for pain. 30 tablet 6  . metFORMIN (GLUCOPHAGE) 500 MG tablet Take 1 tablet (500 mg total) by mouth 2 (two) times daily with a meal. 60 tablet 4  . nabumetone  (RELAFEN) 750 MG tablet Take 1 tablet (750 mg total) by mouth 2 (two) times daily as needed. 60 tablet 6  . pregabalin (LYRICA) 150 MG capsule Take 1 capsule (150 mg total) by mouth 2 (two) times daily. 60 capsule 1  . rosuvastatin (CRESTOR) 10 MG tablet Take 1 tablet (10 mg total) by mouth daily. To lower cholesterol 30 tablet 5  . sertraline (ZOLOFT) 100 MG tablet Take 1 tablet (100 mg total) by mouth daily. 30 tablet 4  . tapentadol (NUCYNTA) 50 MG tablet Take 50 mg by mouth See admin instructions. Take 1 tablet 2-3 times daily prn    . valsartan-hydrochlorothiazide (DIOVAN-HCT) 160-25 MG tablet Take 1 tablet by mouth daily. To lower blood pressure 30 tablet 4   No current facility-administered medications on file prior to visit.    No Known Allergies  Family History  Adopted: Yes    BP 132/70 (BP Location: Left Wrist, Patient Position: Sitting, Cuff Size: Large)   Pulse 86   Ht 6' (1.829 m)   Wt (!) 316 lb (143.3 kg)   SpO2 92%   BMI 42.86 kg/m    Review of Systems denies numbness, erectile dysfunction, decreased urinary stream, muscle weakness, fever, easy bruising, sob, rash, visual loss, and chest pain.  He has fatigue, low libido, chronic weight gain,chronic headache, and depression    Objective:   Physical Exam VS: see vs page GEN: no distress HEAD: head: no deformity eyes: no periorbital swelling, no proptosis external nose and ears are normal NECK: supple, thyroid is not enlarged CHEST WALL: no deformity LUNGS: clear to auscultation BREASTS:  bilat pseudogynecomastia CV: reg rate and rhythm, no murmur.   GENITALIA:  Normal male.   MUSCULOSKELETAL: muscle bulk and strength are grossly normal.  gait is normal and steady EXTEMITIES: no deformity.  no ulcer on the feet.  feet are of normal color and temp.  1+ bilat edema.  There is hyperpigmentation of the legs NEURO:  cn 2-12 grossly intact.   readily moves all 4's.  sensation is intact to touch on all 4's. SKIN:   Normal texture and temperature.  No rash or suspicious lesion is visible.  Normal male hair distribution.   NODES:  None palpable at the neck PSYCH: alert, well-oriented.  Does not appear anxious nor depressed.  Pt signs release of info from [employer], [place]  Lab Results  Component Value Date   TSH 1.06 03/14/2019  Assessment & Plan:  Hypogonadism, new to me, uncertain etiology   Patient Instructions  Blood tests are requested for you today.  We'll let you know about the results.   Testosterone treatment has risks, including increased or decreased fertility (depending on the type of treatment), hair loss, prostate cancer, benign prostate enlargement, blood clots, liver problems, lower hdl ("good cholesterol"), polycythemia (opposite of anemia), sleep apnea, and behavior changes.   Weight loss also helps the testosterone.   Based on the results, I may be able to prescribe for you a pill for this.

## 2019-03-15 LAB — PROLACTIN: Prolactin: 5.4 ng/mL (ref 2.0–18.0)

## 2019-03-18 ENCOUNTER — Encounter: Payer: Self-pay | Admitting: Endocrinology

## 2019-03-18 LAB — TESTOSTERONE,FREE AND TOTAL
Testosterone, Free: 2.2 pg/mL — ABNORMAL LOW (ref 7.2–24.0)
Testosterone: 109 ng/dL — ABNORMAL LOW (ref 264–916)

## 2019-03-19 ENCOUNTER — Encounter: Payer: Self-pay | Admitting: Endocrinology

## 2019-03-19 ENCOUNTER — Other Ambulatory Visit: Payer: Self-pay | Admitting: Endocrinology

## 2019-03-19 MED ORDER — CLOMIPHENE CITRATE 50 MG PO TABS: 25.0000 mg | ORAL_TABLET | Freq: Every day | ORAL | 5 refills | Status: DC

## 2019-03-20 ENCOUNTER — Other Ambulatory Visit: Payer: Self-pay | Admitting: Physical Medicine and Rehabilitation

## 2019-03-20 ENCOUNTER — Telehealth: Payer: Self-pay | Admitting: Physical Medicine and Rehabilitation

## 2019-03-20 ENCOUNTER — Telehealth: Payer: Self-pay

## 2019-03-20 MED ORDER — CYCLOBENZAPRINE HCL 10 MG PO TABS: 10.0000 mg | ORAL_TABLET | Freq: Three times a day (TID) | ORAL | 0 refills | Status: DC | PRN

## 2019-03-20 MED ORDER — PREGABALIN 150 MG PO CAPS: 150.0000 mg | ORAL_CAPSULE | Freq: Two times a day (BID) | ORAL | 0 refills | Status: DC

## 2019-03-20 NOTE — Telephone Encounter (Signed)
Please send to [facility] [place] (which I cannot find)

## 2019-03-20 NOTE — Telephone Encounter (Signed)
Left message to advise patient

## 2019-03-20 NOTE — Telephone Encounter (Signed)
Patient called today stating MD was supposed to send in a new Rx to [facility] in [place]-patient was unable to tell me what the Rx was for and is requesting a call back from the nurse to discuss

## 2019-03-20 NOTE — Telephone Encounter (Signed)
Please refer to pt request. Has also sent My Chart messages as well

## 2019-03-20 NOTE — Telephone Encounter (Signed)
Done

## 2019-03-20 NOTE — Telephone Encounter (Signed)
Following message sent via My Chart:  Hi Aaron Giles,  We received a message from the front desk indicating a missing prescription. It appears Dr. Everardo All sent a prescription for Clomid to your pharmacy. Please refer below:  Outpatient Medication Detail   Disp Refills Start End   clomiPHENE (CLOMID) 50 MG tablet 15 tablet 5 03/19/2019    Sig - Route: Take 0.5 tablets (25 mg total) by mouth daily. - Oral   Sent to pharmacy as: clomiPHENE (CLOMID) 50 MG tablet   E-Prescribing Status: Receipt confirmed by pharmacy (03/19/2019 2:37 PM EST)    Pharmacy  [facility] [phone] - [place], [place] - [phone] [facility] [address]  [address] [name] [address]  Phone:  [phone] Fax:  [phone]    Please let us know if there is another prescription that he should have sent rather than the above mentioned.  Thank you, [facility]

## 2019-03-20 NOTE — Telephone Encounter (Signed)
Please advise what medication is to be sent. I am not seeing a Rx

## 2019-03-20 NOTE — Telephone Encounter (Signed)
Outpatient Medication Detail   Disp Refills Start End   clomiPHENE (CLOMID) 50 MG tablet 15 tablet 5 03/19/2019    Sig - Route: Take 0.5 tablets (25 mg total) by mouth daily. - Oral   Sent to pharmacy as: clomiPHENE (CLOMID) 50 MG tablet   E-Prescribing Status: Receipt confirmed by pharmacy (03/19/2019  2:37 PM EST)

## 2019-03-20 NOTE — Telephone Encounter (Signed)
So I will refill this time and please tell him to tell Dr. Metta Clines this would be my last refill so it would be ok for Dr. Metta Clines to prescribe if he feels that those medications are an appropriate part of his ongoing pain care.

## 2019-03-20 NOTE — Telephone Encounter (Signed)
clomiphene

## 2019-03-20 NOTE — Telephone Encounter (Signed)
Called patient to advise. He expressed understanding. 

## 2019-03-20 NOTE — Telephone Encounter (Signed)
Please advise 

## 2019-03-20 NOTE — Telephone Encounter (Signed)
Appears that he is under Dr. Ewing Schlein for pain management, which is good, he needs to coordinate Cyclobenaprine and Lyrica with him since it is better to have one physician doing that to see interactions of medications.

## 2019-03-21 ENCOUNTER — Encounter: Payer: Self-pay | Admitting: Endocrinology

## 2019-04-03 ENCOUNTER — Encounter: Payer: Self-pay | Admitting: Family Medicine

## 2019-04-18 ENCOUNTER — Other Ambulatory Visit: Payer: Self-pay

## 2019-04-18 NOTE — Patient Outreach (Signed)
[facility]) Care Management  04/18/2019  [name] 06-13-1960 [id number]   Medication Adherence call to Mr. Aaron Giles Hippa Identifiers Verify spoke with patient he is past due on Rosuvastatin 10 mg ,patient explain he takes 1 tablet daily,patient explain he had ask [facility] to [facility] prescription for a 90 days supply but doctor had denied doctor Fulp is no longer at this practice,patiet said he has place an appointment with the new doctor in the same facility,try getting in touch with the office but oficce is closed due to the weather. Mr. Belt is showing past due under [place] [employer].   Lillia Abed CPhT Pharmacy Technician [facility] Direct Dial [phone]  Fax [phone] [email or web address]

## 2019-04-24 ENCOUNTER — Ambulatory Visit (INDEPENDENT_AMBULATORY_CARE_PROVIDER_SITE_OTHER): Payer: Medicare Other | Admitting: Internal Medicine

## 2019-04-24 ENCOUNTER — Encounter: Payer: Self-pay | Admitting: Internal Medicine

## 2019-04-24 DIAGNOSIS — G8929 Other chronic pain: Secondary | ICD-10-CM

## 2019-04-24 DIAGNOSIS — R5383 Other fatigue: Secondary | ICD-10-CM

## 2019-04-24 DIAGNOSIS — F329 Major depressive disorder, single episode, unspecified: Secondary | ICD-10-CM | POA: Diagnosis not present

## 2019-04-24 DIAGNOSIS — F32A Depression, unspecified: Secondary | ICD-10-CM

## 2019-04-24 DIAGNOSIS — M549 Dorsalgia, unspecified: Secondary | ICD-10-CM | POA: Diagnosis not present

## 2019-04-24 NOTE — Progress Notes (Signed)
Virtual Visit via Telephone Note  I connected with Aaron Giles, on 04/24/2019 at 1:32 PM by telephone due to the COVID-19 pandemic and verified that I am speaking with the correct person using two identifiers.   Consent: I discussed the limitations, risks, security and privacy concerns of performing an evaluation and management service by telephone and the availability of in person appointments. I also discussed with the patient that there may be a patient responsible charge related to this service. The patient expressed understanding and agreed to proceed.   Location of Patient: Home  Location of Provider: Home    Persons participating in Telemedicine visit: Phinehas Dault Montel [facility] Dr. Earlene Plater      History of Present Illness: Patient has a visit for referral requests. Patient sees pain management for chronic back pain. His pain level is increasing and the distribution of pain is becoming more widespread. Reports suffers from permanent nerve damage in lower back. Has DDD and has had spinal fusion surgery in the past. Also has OA in his neck. Has been to PT and chiropractor. Has also used TENs unit.    Patient would also like to see a psychiatrist. Has a history of depression, is currently on Zoloft.   Also reports chronic fatigue. Is using CPAP machine religiously. Reports he can drink coffee and then fall asleep. Reports his machine is working well and it was remotely adjusted. This interferes with his ability to go out and do things. He sleeps well during the night, occasionally wakes up but normally sleeps right through. Sleeps 6-8 hours per night. Finds that he has to nap throughout the day. Wakes up feeling tired. No headaches in AM.    Past Medical History:  Diagnosis Date  . ADHD   . Anxiety   . Arthritis   . Carpal tunnel syndrome on right   . Cellulitis   . Cervical spondylosis   . Chronic hepatitis C (HCC)   . Chronic pain syndrome   . COPD (chronic  obstructive pulmonary disease) (HCC)   . Depression   . Dyslipidemia associated with type 2 diabetes mellitus (HCC)   . GERD (gastroesophageal reflux disease)   . History of colonic polyps   . History of umbilical hernia   . Hypertension   . Hypogonadotropic hypogonadism (HCC)   . Lumbar spondylosis   . Neuromuscular disorder (HCC)   . OSA (obstructive sleep apnea)   . Peripheral autonomic neuropathy   . Peripheral edema   . Type 2 diabetes mellitus with diabetic neuropathy (HCC)   . Venous stasis    No Known Allergies  Current Outpatient Medications on File Prior to Visit  Medication Sig Dispense Refill  . busPIRone (BUSPAR) 10 MG tablet Take 2 tablets (20 mg total) by mouth 2 (two) times daily. 120 tablet 4  . clomiPHENE (CLOMID) 50 MG tablet Take 0.5 tablets (25 mg total) by mouth daily. 15 tablet 5  . cyclobenzaprine (FLEXERIL) 10 MG tablet Take 1 tablet (10 mg total) by mouth 3 (three) times daily as needed for muscle spasms. 30 tablet 0  . meloxicam (MOBIC) 15 MG tablet Take 0.5-1 tablets (7.5-15 mg total) by mouth daily as needed for pain. 30 tablet 6  . metFORMIN (GLUCOPHAGE) 500 MG tablet Take 1 tablet (500 mg total) by mouth 2 (two) times daily with a meal. 60 tablet 4  . nabumetone (RELAFEN) 750 MG tablet Take 1 tablet (750 mg total) by mouth 2 (two) times daily as needed. 60 tablet 6  .  pregabalin (LYRICA) 150 MG capsule Take 1 capsule (150 mg total) by mouth 2 (two) times daily. 60 capsule 0  . rosuvastatin (CRESTOR) 10 MG tablet Take 1 tablet (10 mg total) by mouth daily. To lower cholesterol 30 tablet 5  . sertraline (ZOLOFT) 100 MG tablet Take 1 tablet (100 mg total) by mouth daily. 30 tablet 4  . tapentadol (NUCYNTA) 50 MG tablet Take 50 mg by mouth See admin instructions. Take 1 tablet 2-3 times daily prn    . valsartan-hydrochlorothiazide (DIOVAN-HCT) 160-25 MG tablet Take 1 tablet by mouth daily. To lower blood pressure 30 tablet 4   No current  facility-administered medications on file prior to visit.    Observations/Objective: NAD. Speaking clearly.  Work of breathing normal.  Alert and oriented. Mood appropriate.   Assessment and Plan: 1. Chronic back pain, unspecified back location, unspecified back pain laterality - Ambulatory referral to Orthopedic Surgery  2. Depression, unspecified depression type - Ambulatory referral to Psychiatry  3. Fatigue, unspecified type Suffering from extreme fatigue. Has recent normal CBC, TSH, CMET. I am concerned that his OSA has worsened given chronic fatigue. Does not seem to have insomnia. Patient requesting Modafinil. Discussed that I don't typically prescribe stimulants for fatigue and would like him to be evaluated by a physician that has additional training in OSA and sleep disorders. Consult visit with Dr. Joya Gaskins on 3/23 scheduled.    Follow Up Instructions: Follow up with Dr. Joya Gaskins on 3/23    I discussed the assessment and treatment plan with the patient. The patient was provided an opportunity to ask questions and all were answered. The patient agreed with the plan and demonstrated an understanding of the instructions.   The patient was advised to call back or seek an in-person evaluation if the symptoms worsen or if the condition fails to improve as anticipated.     I provided 14 minutes total of non-face-to-face time during this encounter including median intraservice time, reviewing previous notes, investigations, ordering medications, medical decision making, coordinating care and patient verbalized understanding at the end of the visit.    Phill Myron, D.O. Primary Care at [facility]  04/24/2019, 1:32 PM

## 2019-05-01 DIAGNOSIS — M542 Cervicalgia: Secondary | ICD-10-CM | POA: Diagnosis not present

## 2019-05-01 DIAGNOSIS — M5030 Other cervical disc degeneration, unspecified cervical region: Secondary | ICD-10-CM | POA: Diagnosis not present

## 2019-05-01 DIAGNOSIS — M545 Low back pain: Secondary | ICD-10-CM | POA: Diagnosis not present

## 2019-05-01 DIAGNOSIS — M5136 Other intervertebral disc degeneration, lumbar region: Secondary | ICD-10-CM | POA: Diagnosis not present

## 2019-05-01 DIAGNOSIS — G894 Chronic pain syndrome: Secondary | ICD-10-CM | POA: Diagnosis not present

## 2019-05-01 DIAGNOSIS — M4326 Fusion of spine, lumbar region: Secondary | ICD-10-CM | POA: Diagnosis not present

## 2019-05-06 DIAGNOSIS — M1712 Unilateral primary osteoarthritis, left knee: Secondary | ICD-10-CM | POA: Diagnosis not present

## 2019-05-06 DIAGNOSIS — M542 Cervicalgia: Secondary | ICD-10-CM | POA: Diagnosis not present

## 2019-05-06 DIAGNOSIS — M5416 Radiculopathy, lumbar region: Secondary | ICD-10-CM | POA: Diagnosis not present

## 2019-05-06 DIAGNOSIS — F17210 Nicotine dependence, cigarettes, uncomplicated: Secondary | ICD-10-CM | POA: Diagnosis not present

## 2019-05-06 DIAGNOSIS — M1711 Unilateral primary osteoarthritis, right knee: Secondary | ICD-10-CM | POA: Diagnosis not present

## 2019-05-14 ENCOUNTER — Other Ambulatory Visit: Payer: Self-pay | Admitting: Physical Medicine and Rehabilitation

## 2019-05-14 DIAGNOSIS — M542 Cervicalgia: Secondary | ICD-10-CM

## 2019-05-14 DIAGNOSIS — M5416 Radiculopathy, lumbar region: Secondary | ICD-10-CM

## 2019-05-20 NOTE — Progress Notes (Addendum)
Subjective:    Patient ID: Aaron Giles, male    DOB: [birth year], 59 y.o.   MRN: [id number]  59 y.o.Referral for OSA evaluation.   Prior hx of T2DM  Obesity, prior dx OSA, Chronic Hep C, Aortic atherosclerosis, HTN, Chronic venous stasis LE, Diverticular dz, lumbar and cervical disease, chronic pain on opioids.    This patient had previously been cared for in the [facility] in [place] and recently moved to the [place] area.  Patient has had sleep apnea since 2018.  He was diagnosed as severe sleep apnea with an AHI of greater than 35.  He was prescribed a CPAP device initially with a ramp up with a base of 10 cm water pressure up to a max of 20 cm water pressure based on his response with a full facemask.  The patient experienced significant air leak therefore the settings just before his moved to [place] were adjusted to a peak of 12 cm and base of 6 cm water pressure.  See below the most recent download reports from his CPAP device and his previous sleep study in 2018  The patient states he is tolerating the device better but since this adjustment occurred he has had continued difficulty with daytime sleepiness.  He falls asleep while watching TV he has still memory issues difficulty with concentration.  He does not fall asleep operating motor vehicles.  He states he feels reasonably well rested when he gets up in the morning with the CPAP device however as the day goes on he gets increasingly hypersomnolent.  He expresses an interest in potentially accessing one of the oral agents that augmented CPAP therapy such as Provigil  The patient's BMI continues to be elevated he did state he lost some weight but then it increased again after he transferred to [facility].  He currently is retired.  Patient has a history of chronic hepatitis C, aortic atherosclerosis, hypertension, chronic venous stasis lower extremities, diverticular disease, severe lumbar disease status post  laminectomy and now cervical disease for which she will need potential surgery there as well.  He is on chronic pain protocol with chronic opiates and also takes high-dose Lyrica.  Also history of type 2 diabetes and morbid obesity  There is no history of atrial fibrillation or other cardiac history.  Note the patient does score high on the depression screen today in the office.  He is still actively smoking as well.  Note the patient has to take 2-3 naps a day because he is so sleepy.  The patient is referred by his new primary care provider Dr. Earlene Plater for further evaluation of his sleep apnea syndrome.   The patient is unsure of his DME provider and he is going to call us back and let us know who is the DME provider   The previous sleep titration at lower pressures, resulted in AHIs 10 to 15   From prior of 35,  Needs to be repeated.  Past Medical History:  Diagnosis Date  . ADHD   . Anxiety   . Arthritis   . Carpal tunnel syndrome on right   . Cellulitis   . Cervical spondylosis   . Chronic hepatitis C (HCC)   . Chronic pain syndrome   . COPD (chronic obstructive pulmonary disease) (HCC)   . Depression   . Dyslipidemia associated with type 2 diabetes mellitus (HCC)   . GERD (gastroesophageal reflux disease)   . History of colonic polyps   .  History of umbilical hernia   . Hypertension   . Hypogonadotropic hypogonadism (HCC)   . Lumbar spondylosis   . Neuromuscular disorder (HCC)   . OSA (obstructive sleep apnea)   . Peripheral autonomic neuropathy   . Peripheral edema   . Type 2 diabetes mellitus with diabetic neuropathy (HCC)   . Venous stasis      Family History  Adopted: Yes     Social History   Socioeconomic History  . Marital status: Divorced    Spouse name: Not on file  . Number of children: Not on file  . Years of education: Not on file  . Highest education level: Not on file  Occupational History  . Not on file  Tobacco Use  . Smoking status: Current  Every Day Smoker    Packs/day: 0.50  . Smokeless tobacco: Never Used  Substance and Sexual Activity  . Alcohol use: Not Currently  . Drug use: Never  . Sexual activity: Not on file  Other Topics Concern  . Not on file  Social History Narrative  . Not on file   Social Determinants of Health   Financial Resource Strain:   . Difficulty of Paying Living Expenses:   Food Insecurity:   . Worried About Programme researcher, broadcasting/film/videounning Out of Food in the Last Year:   . Baristaan Out of Food in the Last Year:   Transportation Needs:   . Freight forwarderLack of Transportation (Medical):   Marland Kitchen. Lack of Transportation (Non-Medical):   Physical Activity:   . Days of Exercise per Week:   . Minutes of Exercise per Session:   Stress:   . Feeling of Stress :   Social Connections:   . Frequency of Communication with Friends and Family:   . Frequency of Social Gatherings with Friends and Family:   . Attends Religious Services:   . Active Member of Clubs or Organizations:   . Attends BankerClub or Organization Meetings:   Marland Kitchen. Marital Status:   Intimate Partner Violence:   . Fear of Current or Ex-Partner:   . Emotionally Abused:   Marland Kitchen. Physically Abused:   . Sexually Abused:      No Known Allergies   Outpatient Medications Prior to Visit  Medication Sig Dispense Refill  . busPIRone (BUSPAR) 10 MG tablet Take 2 tablets (20 mg total) by mouth 2 (two) times daily. 120 tablet 4  . cyclobenzaprine (FLEXERIL) 10 MG tablet Take 1 tablet (10 mg total) by mouth 3 (three) times daily as needed for muscle spasms. 30 tablet 0  . HYDROcodone-acetaminophen (NORCO/VICODIN) 5-325 MG tablet Take 1 tablet by mouth 3 (three) times daily as needed.    . meloxicam (MOBIC) 15 MG tablet Take 0.5-1 tablets (7.5-15 mg total) by mouth daily as needed for pain. 30 tablet 6  . metFORMIN (GLUCOPHAGE) 500 MG tablet Take 1 tablet (500 mg total) by mouth 2 (two) times daily with a meal. 60 tablet 4  . nabumetone (RELAFEN) 750 MG tablet Take 1 tablet (750 mg total) by mouth 2  (two) times daily as needed. 60 tablet 6  . pregabalin (LYRICA) 150 MG capsule Take 1 capsule (150 mg total) by mouth 2 (two) times daily. 60 capsule 0  . rosuvastatin (CRESTOR) 10 MG tablet Take 1 tablet (10 mg total) by mouth daily. To lower cholesterol 30 tablet 5  . sertraline (ZOLOFT) 100 MG tablet Take 1 tablet (100 mg total) by mouth daily. 30 tablet 4  . valsartan-hydrochlorothiazide (DIOVAN-HCT) 160-25 MG tablet Take 1 tablet by  mouth daily. To lower blood pressure 30 tablet 4  . clomiPHENE (CLOMID) 50 MG tablet Take 0.5 tablets (25 mg total) by mouth daily. (Patient not taking: Reported on 05/21/2019) 15 tablet 5  . tapentadol (NUCYNTA) 50 MG tablet Take 50 mg by mouth See admin instructions. Take 1 tablet 2-3 times daily prn     No facility-administered medications prior to visit.   Review of Systems  Constitutional: Positive for activity change, appetite change, fatigue and unexpected weight change.  HENT: Negative.   Respiratory: Positive for apnea and shortness of breath. Negative for cough and wheezing.   Cardiovascular: Positive for leg swelling. Negative for chest pain and palpitations.  Gastrointestinal: Negative.   Musculoskeletal: Positive for back pain and neck pain. Negative for arthralgias.  Skin: Negative.   Neurological: Positive for headaches. Negative for dizziness, seizures, syncope, facial asymmetry, weakness, light-headedness and numbness.  Psychiatric/Behavioral: Positive for dysphoric mood and sleep disturbance. Negative for confusion and suicidal ideas.       Objective:   Physical Exam Vitals:   05/21/19 1538  BP: (!) 162/93  Pulse: (!) 104  SpO2: 92%  Weight: (!) 315 lb (142.9 kg)  Height: 6' (1.829 m)   Note the patient arrived an  Half hour early for his appointment and was quite angry when I was not able to get him until 345 his appointment was at 3 PM I thanked him for his patience and he seemed to accept that and was able to engage in the exam  and encounter  Gen: Pleasant, morbidly obese, in no distress, anxious affect  ENT: No lesions,  mouth clear,  oropharynx clear, no postnasal drip  Neck: No JVD, no TMG, no carotid bruits  Lungs: No use of accessory muscles, no dullness to percussion, clear without rales or rhonchi  Cardiovascular: RRR, heart sounds normal, no murmur or gallops, 2+ peripheral edema  Abdomen: soft and NT, no HSM,  BS normal  Musculoskeletal: No deformities, no cyanosis or clubbing  Neuro: alert, non focal  Skin: Warm, no lesions or rashes  No results found.  Sleep Study from 2018: Burke Keels (M.D.), M.D. - 07/05/2016 10:36 AM EDT POLYSOMNOGRAM INTERPRETATION  Patient Name: Aaron Giles, Aaron Giles  Medical Record Number: [id number] Pt. DOB: [birth year] Height: 72.0 Weight: 260.0 B.M.I: 35.3 Study Date: 07/02/2016  Recording Tech: ZM, Scoring Tech: Y. Heshmat, RPSGT, RST 5/7 Samul Dada, MD Location: [facility]   HISTORY: The patient is a 59 year-old male, 72.0 inches tall and weighing 260.0 lbs. The patient reported use of these medications: Hydrocodon, Metformin, Valsartan, Diclofenc, and Buspirone. Snoring is frequent. Score on the Epworth Sleepiness Scale was 10/24, which is normal. The patient sleeps 5-7 hours nightly. This study is performed for probable sleep apnea syndrome.  METHODS and SCORING CONVENTIONS:  The polysomnograph recorded EEG, eye movement, chin EMG, airflow by nasal pressure, microphone, chest and diaphragmatic excursion, EKG, leg EMG, body position, and capillary oxygen saturation.  Hypopneas require 4% desaturation, by AASM consensus. Absence of flow for an apnea is read from the thermistor. Reduction in flow for hypopnea or RERA is read from the nasal transducer. Apnea/Hypopnea Index (AHI) = the number of apneas and hypopneas per hour of sleep. Respiratory Disturbance Index (RDI) = the number of apneas, hypopneas, and respiratory  effort-related arousals (RERAs) per hour of sleep. Laboratory severity rating is based on RDI, as per AASM consensus where; Mild = 5 - 15, Moderate = 15 - 30, Severe > 30. Medicare uses AHI  and symptoms to determine whether a patient may qualify for CPAP coverage, but the RDI correlates better with long-term risk of morbidity and mortality. Many patients with mildly abnormal RDI but very low AHI, nonetheless experience significant symptomatic improvement when sleep apnea is treated. References available upon request.   DATA SUMMARY: Sleep efficiency was 71.6%, which is normal in the sleep lab, and sleep onset time was 53.5 min, which is prolonged. Total sleep time was 314.0 min. Stage R latency, 106.5 min, was within broad normal limits. Alpha intrusion was absent. Supine sleep was 151.5 mins. The overall respiratory disturbance index (RDI) was 114.1 per hour, and the overall apnea/hypopnea index (AHI) during sleep was 35.9 per hour. There were 49 total apneas, including 0 central and 0 mixed apneas. RDI during Stage R sleep was 125.7 per hour, and RDI while supine was 144.2 per hour. The maximum waking oxygen saturation was 96.0. The average SpO2 in sleep was 92.2, the minimum during sleep was 84.0, and SpO2 was under 88% for 0.3% of the sleep time. There was frequent mild/moderate snoring. Periodic limb movements averaged 0.0 per hour. No loss of significant REM atonia was observed. Cardiac rhythm was normal.  DIAGNOSIS: These findings are compatible with: 1. Severe OSA with AHI of 35.9 was noted. Apneic events were more frequently seen in supine position and during REM sleep  2. Periodic limb movements were not seen. Cardiac rhythm was normal. .  RECOMMENDATIONS: Given the level of severity of the obstructive sleep apnea, CPAP is strongly recommended as the next step in care as it has been shown to be the most-effective and least-invasive treatment.  The patient should be directed to view the Emmi on  OSA. If the patient is amenable to CPAP, please send a message to the Sleep pool at [facility] with the smartphrase "CPAPstart", and the Sleep CAs will then call the patient to schedule CPAP autotitration appointments. If the patient declines CPAP and wants to discuss other treatment options, a referral for a Sleep Physician Consult can be made. The patient should be cautioned to avoid driving while drowsy. The patient should avoid alcohol as it can aggravate OSA. Weight loss should be encouraged.   Download report: Air view- Lowering air pressure for comfort:  Anoop, Hemmer [id number] 03-23-1960 07/14/2016 3% AutoSet Patient ID D.O.B. Setup date Compliant Last 30 AirSense 10 AutoSet Back to patients Charts Patient details Prescription Remote Assist Notes Logs Preferences Action Group logs Prescription changes Data access changes Device faults 05/30/2017, 08:26 AM Settings requested by Lizbeth Bark Set Mode to AutoSet Set Min Pressure to 6.0 cmH2O Set Max Pressure to 12.0 cmH2O Set Response to Soft Set EPR to Fulltime Set EPR level to 3 Set Ramp enable to Auto Set Ramp time to 20 min Set Start pressure to 4.0 cmH2O  Previous Settings Set EPR level to 3 Set Mode to AutoSet Set Min Pressure to 10.0 cmH2O Set Max Pressure to 20.0 cmH2O Set Response to Soft Set EPR to Fulltime Set Ramp enable to Auto Set Ramp time to 20 min Set Start pressure to 4.0 cmH2O        Assessment & Plan:  I personally reviewed all images and lab data in the [facility] system as well as any outside material available during this office visit and agree with the  radiology impressions.   Sleep apnea Severe sleep apnea and despite adequate pressure settings on his CPAP device the patient is still having difficulty with hypersomnolence during  the day  I have asked the patient to provide me the DME company that provided his CPAP in [place] so that we can obtain another download report on  his current settings which appear to be a ramp up from a base of 6 cm to 12 cm water pressure.  1 concern I have is that these are in adequate pressure settings for this patient for the severity of his sleep apnea.  He may even in fact need a bilevel device which will likely require another sleep study  Furthermore he is complex in nature due to his other comorbid conditions and he is on several medications that place him at high risk to exacerbate his sleep apnea including the Lyrica and opiates for his chronic pain syndrome which appear to be valid given the severity of his lumbar and cervical disc disease  Also his blood pressure is poorly controlled and part of this may be lifestyle and other compliance factors and if his sleep apnea is not well controlled this will drive his blood pressure up  Provigil is a reasonable choice for daytime hypersomnolence provided his sleep apnea is maximally treated with the airway pressure device  I may also need to refer him to the sleep clinic and the pulmonary medicine department for more extensive evaluations by a sleep specialist at that clinic  In the interim I will attempt to obtain the Provigil however [place] healthcare requires a prior authorization and we are in the process of pursuing that while awaiting word from the patient as to the name of his DME company  Hypertension Hypertension poorly controlled in this patient aggravated by ongoing tobacco use and associated uncontrolled sleep apnea   Diagnoses and all orders for this visit:  Obstructive sleep apnea syndrome  Essential hypertension  Other orders -     modafinil (PROVIGIL) 200 MG tablet; Take 1 tablet (200 mg total) by mouth daily. Do not take close to bedtime

## 2019-05-21 ENCOUNTER — Ambulatory Visit: Payer: Medicare Other | Attending: Critical Care Medicine | Admitting: Critical Care Medicine

## 2019-05-21 ENCOUNTER — Encounter: Payer: Self-pay | Admitting: Critical Care Medicine

## 2019-05-21 ENCOUNTER — Other Ambulatory Visit: Payer: Self-pay

## 2019-05-21 VITALS — BP 162/93 | HR 104 | Ht 72.0 in | Wt 315.0 lb

## 2019-05-21 DIAGNOSIS — G4733 Obstructive sleep apnea (adult) (pediatric): Secondary | ICD-10-CM

## 2019-05-21 DIAGNOSIS — I10 Essential (primary) hypertension: Secondary | ICD-10-CM

## 2019-05-21 MED ORDER — MODAFINIL 200 MG PO TABS: 200.0000 mg | ORAL_TABLET | Freq: Every day | ORAL | 0 refills | Status: DC

## 2019-05-21 NOTE — Patient Instructions (Signed)
Begin Provigil one tablet daily, see below.  No other medication changes  Call with your DME provider for your cpap so we can get a download  Video visit with Dr Delford Field in 3 weeks to see how you are doing on the medication

## 2019-05-22 ENCOUNTER — Telehealth: Payer: Self-pay

## 2019-05-22 ENCOUNTER — Encounter: Payer: Self-pay | Admitting: Critical Care Medicine

## 2019-05-22 NOTE — Assessment & Plan Note (Signed)
Severe sleep apnea and despite adequate pressure settings on his CPAP device the patient is still having difficulty with hypersomnolence during the day  I have asked the patient to provide me the DME company that provided his CPAP in [place] so that we can obtain another download report on his current settings which appear to be a ramp up from a base of 6 cm to 12 cm water pressure.  1 concern I have is that these are in adequate pressure settings for this patient for the severity of his sleep apnea.  He may even in fact need a bilevel device which will likely require another sleep study  Furthermore he is complex in nature due to his other comorbid conditions and he is on several medications that place him at high risk to exacerbate his sleep apnea including the Lyrica and opiates for his chronic pain syndrome which appear to be valid given the severity of his lumbar and cervical disc disease  Also his blood pressure is poorly controlled and part of this may be lifestyle and other compliance factors and if his sleep apnea is not well controlled this will drive his blood pressure up  Provigil is a reasonable choice for daytime hypersomnolence provided his sleep apnea is maximally treated with the airway pressure device  I may also need to refer him to the sleep clinic and the pulmonary medicine department for more extensive evaluations by a sleep specialist at that clinic  In the interim I will attempt to obtain the Provigil however [place] healthcare requires a prior authorization and we are in the process of pursuing that while awaiting word from the patient as to the name of his DME company

## 2019-05-22 NOTE — Assessment & Plan Note (Signed)
Hypertension poorly controlled in this patient aggravated by ongoing tobacco use and associated uncontrolled sleep apnea

## 2019-05-22 NOTE — Telephone Encounter (Signed)
Prior Auth for Provigil approved thru 11/22/2019

## 2019-05-29 DIAGNOSIS — M5030 Other cervical disc degeneration, unspecified cervical region: Secondary | ICD-10-CM | POA: Diagnosis not present

## 2019-05-29 DIAGNOSIS — E1140 Type 2 diabetes mellitus with diabetic neuropathy, unspecified: Secondary | ICD-10-CM | POA: Diagnosis not present

## 2019-05-29 DIAGNOSIS — G894 Chronic pain syndrome: Secondary | ICD-10-CM | POA: Diagnosis not present

## 2019-05-29 DIAGNOSIS — R519 Headache, unspecified: Secondary | ICD-10-CM | POA: Diagnosis not present

## 2019-05-29 DIAGNOSIS — M48062 Spinal stenosis, lumbar region with neurogenic claudication: Secondary | ICD-10-CM | POA: Diagnosis not present

## 2019-05-29 DIAGNOSIS — M542 Cervicalgia: Secondary | ICD-10-CM | POA: Diagnosis not present

## 2019-05-29 DIAGNOSIS — J449 Chronic obstructive pulmonary disease, unspecified: Secondary | ICD-10-CM | POA: Diagnosis not present

## 2019-05-29 DIAGNOSIS — G4733 Obstructive sleep apnea (adult) (pediatric): Secondary | ICD-10-CM | POA: Diagnosis not present

## 2019-05-29 DIAGNOSIS — R609 Edema, unspecified: Secondary | ICD-10-CM | POA: Diagnosis not present

## 2019-05-29 DIAGNOSIS — M5136 Other intervertebral disc degeneration, lumbar region: Secondary | ICD-10-CM | POA: Diagnosis not present

## 2019-05-29 DIAGNOSIS — M25559 Pain in unspecified hip: Secondary | ICD-10-CM | POA: Diagnosis not present

## 2019-06-13 ENCOUNTER — Other Ambulatory Visit: Payer: Medicare Other

## 2019-06-18 ENCOUNTER — Ambulatory Visit
Admission: RE | Admit: 2019-06-18 | Discharge: 2019-06-18 | Disposition: A | Discharge status: Home / Self Care | Payer: Medicare Other | Source: Ambulatory Visit | Attending: Physical Medicine and Rehabilitation | Admitting: Physical Medicine and Rehabilitation

## 2019-06-18 ENCOUNTER — Encounter: Payer: Medicare Other | Admitting: Critical Care Medicine

## 2019-06-18 DIAGNOSIS — M4802 Spinal stenosis, cervical region: Secondary | ICD-10-CM | POA: Diagnosis not present

## 2019-06-18 DIAGNOSIS — M542 Cervicalgia: Secondary | ICD-10-CM

## 2019-06-18 DIAGNOSIS — M48061 Spinal stenosis, lumbar region without neurogenic claudication: Secondary | ICD-10-CM | POA: Diagnosis not present

## 2019-06-18 DIAGNOSIS — M5416 Radiculopathy, lumbar region: Secondary | ICD-10-CM

## 2019-06-18 MED ORDER — GADOBENATE DIMEGLUMINE 529 MG/ML IV SOLN
20.0000 mL | Freq: Once | INTRAVENOUS | Status: AC | PRN
  Administered 2019-06-18: 20 mL via INTRAVENOUS

## 2019-06-19 ENCOUNTER — Other Ambulatory Visit: Payer: Self-pay

## 2019-06-19 DIAGNOSIS — E119 Type 2 diabetes mellitus without complications: Secondary | ICD-10-CM

## 2019-06-19 DIAGNOSIS — F339 Major depressive disorder, recurrent, unspecified: Secondary | ICD-10-CM

## 2019-06-19 DIAGNOSIS — I10 Essential (primary) hypertension: Secondary | ICD-10-CM

## 2019-06-19 MED ORDER — METFORMIN HCL 500 MG PO TABS: 500.0000 mg | ORAL_TABLET | Freq: Two times a day (BID) | ORAL | 2 refills | Status: DC

## 2019-06-19 MED ORDER — BUSPIRONE HCL 10 MG PO TABS: 20.0000 mg | ORAL_TABLET | Freq: Two times a day (BID) | ORAL | 2 refills | Status: DC

## 2019-06-19 MED ORDER — VALSARTAN-HYDROCHLOROTHIAZIDE 160-25 MG PO TABS: 1.0000 | ORAL_TABLET | Freq: Every day | ORAL | 2 refills | Status: DC

## 2019-06-19 MED ORDER — SERTRALINE HCL 100 MG PO TABS: 100.0000 mg | ORAL_TABLET | Freq: Every day | ORAL | 2 refills | Status: DC

## 2019-06-26 DIAGNOSIS — M5030 Other cervical disc degeneration, unspecified cervical region: Secondary | ICD-10-CM | POA: Diagnosis not present

## 2019-06-26 DIAGNOSIS — M5136 Other intervertebral disc degeneration, lumbar region: Secondary | ICD-10-CM | POA: Diagnosis not present

## 2019-06-26 DIAGNOSIS — M4326 Fusion of spine, lumbar region: Secondary | ICD-10-CM | POA: Diagnosis not present

## 2019-06-26 DIAGNOSIS — G894 Chronic pain syndrome: Secondary | ICD-10-CM | POA: Diagnosis not present

## 2019-07-05 DIAGNOSIS — M1711 Unilateral primary osteoarthritis, right knee: Secondary | ICD-10-CM | POA: Diagnosis not present

## 2019-07-05 DIAGNOSIS — M5416 Radiculopathy, lumbar region: Secondary | ICD-10-CM | POA: Diagnosis not present

## 2019-07-05 DIAGNOSIS — M1712 Unilateral primary osteoarthritis, left knee: Secondary | ICD-10-CM | POA: Diagnosis not present

## 2019-07-19 DIAGNOSIS — M1712 Unilateral primary osteoarthritis, left knee: Secondary | ICD-10-CM | POA: Diagnosis not present

## 2019-07-23 DIAGNOSIS — M129 Arthropathy, unspecified: Secondary | ICD-10-CM | POA: Diagnosis not present

## 2019-07-23 DIAGNOSIS — G8929 Other chronic pain: Secondary | ICD-10-CM | POA: Diagnosis not present

## 2019-07-23 DIAGNOSIS — M545 Low back pain: Secondary | ICD-10-CM | POA: Diagnosis not present

## 2019-07-23 DIAGNOSIS — Z79899 Other long term (current) drug therapy: Secondary | ICD-10-CM | POA: Diagnosis not present

## 2019-07-23 DIAGNOSIS — G894 Chronic pain syndrome: Secondary | ICD-10-CM | POA: Diagnosis not present

## 2019-07-23 DIAGNOSIS — E559 Vitamin D deficiency, unspecified: Secondary | ICD-10-CM | POA: Diagnosis not present

## 2019-07-24 DIAGNOSIS — Z79891 Long term (current) use of opiate analgesic: Secondary | ICD-10-CM | POA: Diagnosis not present

## 2019-07-24 DIAGNOSIS — M5136 Other intervertebral disc degeneration, lumbar region: Secondary | ICD-10-CM | POA: Diagnosis not present

## 2019-07-24 DIAGNOSIS — G894 Chronic pain syndrome: Secondary | ICD-10-CM | POA: Diagnosis not present

## 2019-07-26 DIAGNOSIS — M1712 Unilateral primary osteoarthritis, left knee: Secondary | ICD-10-CM | POA: Diagnosis not present

## 2019-08-02 DIAGNOSIS — M1712 Unilateral primary osteoarthritis, left knee: Secondary | ICD-10-CM | POA: Diagnosis not present

## 2019-08-15 DIAGNOSIS — M48061 Spinal stenosis, lumbar region without neurogenic claudication: Secondary | ICD-10-CM | POA: Diagnosis not present

## 2019-08-16 ENCOUNTER — Other Ambulatory Visit: Payer: Self-pay | Admitting: Family Medicine

## 2019-08-16 DIAGNOSIS — E785 Hyperlipidemia, unspecified: Secondary | ICD-10-CM

## 2019-08-21 DIAGNOSIS — M5136 Other intervertebral disc degeneration, lumbar region: Secondary | ICD-10-CM | POA: Diagnosis not present

## 2019-08-21 DIAGNOSIS — Z79891 Long term (current) use of opiate analgesic: Secondary | ICD-10-CM | POA: Diagnosis not present

## 2019-08-21 DIAGNOSIS — G894 Chronic pain syndrome: Secondary | ICD-10-CM | POA: Diagnosis not present

## 2019-08-21 DIAGNOSIS — M5030 Other cervical disc degeneration, unspecified cervical region: Secondary | ICD-10-CM | POA: Diagnosis not present

## 2019-08-23 DIAGNOSIS — G894 Chronic pain syndrome: Secondary | ICD-10-CM | POA: Diagnosis not present

## 2019-09-05 ENCOUNTER — Other Ambulatory Visit: Payer: Self-pay | Admitting: Family Medicine

## 2019-09-11 ENCOUNTER — Telehealth: Payer: Self-pay

## 2019-09-11 NOTE — Telephone Encounter (Signed)

## 2019-09-12 ENCOUNTER — Ambulatory Visit (INDEPENDENT_AMBULATORY_CARE_PROVIDER_SITE_OTHER): Payer: Medicare Other | Admitting: Internal Medicine

## 2019-09-12 ENCOUNTER — Other Ambulatory Visit: Payer: Self-pay

## 2019-09-12 ENCOUNTER — Encounter: Payer: Self-pay | Admitting: Internal Medicine

## 2019-09-12 VITALS — BP 132/80 | HR 83 | Temp 97.2°F | Resp 17 | Wt 299.0 lb

## 2019-09-12 DIAGNOSIS — E119 Type 2 diabetes mellitus without complications: Secondary | ICD-10-CM | POA: Diagnosis not present

## 2019-09-12 DIAGNOSIS — G4734 Idiopathic sleep related nonobstructive alveolar hypoventilation: Secondary | ICD-10-CM

## 2019-09-12 DIAGNOSIS — G4733 Obstructive sleep apnea (adult) (pediatric): Secondary | ICD-10-CM

## 2019-09-12 DIAGNOSIS — F411 Generalized anxiety disorder: Secondary | ICD-10-CM | POA: Diagnosis not present

## 2019-09-12 DIAGNOSIS — I10 Essential (primary) hypertension: Secondary | ICD-10-CM

## 2019-09-12 DIAGNOSIS — F339 Major depressive disorder, recurrent, unspecified: Secondary | ICD-10-CM

## 2019-09-12 DIAGNOSIS — R7989 Other specified abnormal findings of blood chemistry: Secondary | ICD-10-CM | POA: Diagnosis not present

## 2019-09-12 DIAGNOSIS — Z1211 Encounter for screening for malignant neoplasm of colon: Secondary | ICD-10-CM

## 2019-09-12 NOTE — Patient Instructions (Signed)
You can call the following help lines: - National suicide hotline (951-158-8561) - National Hop[employer] Network (1-800-SUICIDE)    If you are seriously thinking about hurting yourself or have a plan, please call 911 or go to any emergency room right away for immediate help.

## 2019-09-12 NOTE — Progress Notes (Signed)
Subjective:    Aaron Giles - 59 y.o. male MRN [id number]  Date of birth: [birth year]  HPI  Aaron Giles is here for follow up of chronic medical conditions.  T2DM: Patient is taking Metformin 500 mg BID. Sometimes forgets second dose due to his work schedule. No hypoglycemic events. No polyuria. Has not done as good of job with diet adhere of late.   Depression/Anxiety: Patient reports he has been having a lot of difficulty lately. Has suffered from depression and anxiety chronically. Has been on Zoloft and Buspar for quite some time. Recently he moved to [place] and in with his daughter and feels like she treats him very poorly and their communication/living situation is not great. Also recent life stressor of loss of his father who he never got to reconcile a bad relationship with. Endorses passive suicidal thoughts and does have access to a pistol. Says he has had suicidal thoughts for years but has no active plans and would never self harm. Despite not having a great relationship with his daughter, says he could never leave her behind like that.   Depression screen [facility] 2/9 09/12/2019 05/21/2019 04/24/2019  Decreased Interest 3 3 2   Down, Depressed, Hopeless 3 3 2   PHQ - 2 Score 6 6 4   Altered sleeping 3 2 2   Tired, decreased energy 3 3 3   Change in appetite 3 2 1   Feeling bad or failure about yourself  3 2 0  Trouble concentrating 3 3 1   Moving slowly or fidgety/restless 0 2 0  Suicidal thoughts 3 2 0  PHQ-9 Score 24 22 11   Difficult doing work/chores - Very difficult -   GAD 7 : Generalized Anxiety Score 09/12/2019 05/21/2019  Nervous, Anxious, on Edge 3 1  Control/stop worrying 3 2  Worry too much - different things 3 2  Trouble relaxing 3 2  Restless 3 2  Easily annoyed or irritable 3 2  Afraid - awful might happen 3 3  Total GAD 7 Score 21 14  Anxiety Difficulty - Very difficult     Health Maintenance:  Health Maintenance Due  Topic Date Due  . FOOT EXAM  Never  done  . OPHTHALMOLOGY EXAM  Never done  . COVID-19 Vaccine (1) Never done  . COLONOSCOPY  Never done  . HEMOGLOBIN A1C  07/30/2019    -  reports that he has been smoking. He has been smoking about 0.50 packs per day. He has never used smokeless tobacco. - Review of Systems: Per HPI. - Past Medical History: Patient Active Problem List   Diagnosis Date Noted  . Sleep apnea 03/14/2019  . Screening for malignant neoplasm of prostate 03/14/2019  . Low testosterone 03/14/2019  . Occipital headache 01/28/2019  . Myofascial pain syndrome 01/28/2019  . Post laminectomy syndrome 01/28/2019  . Chronic pain syndrome 01/28/2019  . Venous stasis   . Hypertension   . Diabetes mellitus without complication (HCC)   . Herniated nucleus pulposus, L5-S1 03/28/2018  . Ruptured lumbar disc 03/28/2018  . Lumbar radiculopathy, chronic 03/28/2018  . Long term (current) use of opiate analgesic 07/19/2017  . Hypogonadotropic hypogonadism (HCC) 04/03/2017  . Cervical spondylosis 02/01/2017  . Lumbar spondylosis 10/19/2016  . Diverticulosis of colon 08/11/2016  . Hardening of the aorta (main artery of the heart) (HCC) 08/11/2016  . Hx of colonic polyp 08/11/2016  . Umbilical hernia 08/11/2016  . Obesity, unspecified 07/19/2016  . Type 2 diabetes mellitus with other specified complication (HCC) 07/19/2016  . Vitamin D  deficiency 04/27/2016  . Chronic hepatitis C (HCC) 03/04/2016  . Chronic venous hypertension (idiopathic) without complications of left lower extremity 03/04/2016  . COPD (chronic obstructive pulmonary disease) (HCC) 03/04/2016  . Hx of spinal fusion 03/04/2016  . Nicotine dependence 03/04/2016  . Peripheral autonomic neuropathy 03/04/2016  . Personal history of other diseases of the digestive system 03/04/2016   - Medications: reviewed and updated   Objective:   Physical Exam BP 132/80   Pulse 83   Temp (!) 97.2 F (36.2 C) (Temporal)   Resp 17   Wt 299 lb (135.6 kg)   SpO2 94%    BMI 40.55 kg/m  Physical Exam Constitutional:      General: He is not in acute distress.    Appearance: He is not diaphoretic.  HENT:     Head: Normocephalic and atraumatic.  Eyes:     Conjunctiva/sclera: Conjunctivae normal.  Cardiovascular:     Rate and Rhythm: Normal rate and regular rhythm.     Heart sounds: Normal heart sounds. No murmur heard.   Pulmonary:     Effort: Pulmonary effort is normal. No respiratory distress.     Breath sounds: Normal breath sounds.  Musculoskeletal:        General: Normal range of motion.  Skin:    General: Skin is warm and dry.  Neurological:     Mental Status: He is alert and oriented to person, place, and time.  Psychiatric:        Mood and Affect: Affect normal.        Judgment: Judgment normal.            Assessment & Plan:   1. Major depression, recurrent, chronic (HCC) 2. GAD (generalized anxiety disorder) Patient reports that he is ready to seek more help with MDD and GAD. Will refer to psych. Set up appointment with Jenel Lucks, LCSW in the interim and needs close follow up with this provider. Discussed his suicidal ideations at length and contracted for safety. He is adamant that he would not self harm and that he is safe to return home. Provided him with suicide hotline phone numbers.  - Ambulatory referral to Psychiatry   3. Type 2 diabetes mellitus without complication, without long-term current use of insulin (HCC) Discussed diet and exercise adherence. Will check A1c. If elevated, will plan to increase dose of Metformin.  - Hemoglobin A1c - Ambulatory referral to Ophthalmology  4. Essential hypertension BP is stable. Continue current regimen.  Counseled on blood pressure goal of less than 130/80, low-sodium, DASH diet, medication compliance, 150 minutes of moderate intensity exercise per week. Discussed medication compliance, adverse effects.  5. Chronic intermittent hypoxia with obstructive sleep apnea Patient  reported that his CPAP machine is recording periods of intermittent hypoxia. Patient is complex due to comorbid conditions and on medications that place him at high risk of exacerbation of sleep apnea. Has been seen by Dr. Delford Field who was concerned that current CPAP settings were inadequate and that he might need bilevel device which would require another sleep study. Given recurrent episodes of hypoxia, will send for repeat sleep study.  - PSG Sleep Study; Future  6. Colon cancer screening - Ambulatory referral to Gastroenterology  7. Low testosterone Patient with history of low testosterone. Seen by endocrinology and started on Clomid. Patient was not able to afford this medication and would like to restart his testosterone injections that he was on previously. HCT and PSA obtained with endocrinology were normal. Will obtain  baseline testosterone levels to direct therapy.  - Testosterone,Free and Total     Marcy Siren, D.O. 09/12/2019, 2:19 PM Primary Care at [facility]

## 2019-09-13 ENCOUNTER — Ambulatory Visit: Payer: Medicare Other | Attending: Family Medicine | Admitting: Licensed Clinical Social Worker

## 2019-09-13 DIAGNOSIS — F332 Major depressive disorder, recurrent severe without psychotic features: Secondary | ICD-10-CM

## 2019-09-13 NOTE — Progress Notes (Signed)
Integrated Behavioral Health Visit via Telemedicine (Telephone)  09/13/2019 Aaron Giles [id number]   Session Start time: 2:30 PM  Session End time: 3:00 PM Total time: 30  Referring Provider: Dr. Earlene Plater Type of Visit: Telephonic Patient location: Home [facility] Provider location: Office All persons participating in visit: LCSW and Patient  Confirmed patient's address: Yes  Confirmed patient's phone number: Yes  Any changes to demographics: No   Confirmed patient's insurance: Yes  Any changes to patient's insurance: No   Discussed confidentiality: Yes    The following statements were read to the patient and/or legal guardian that are established with the [facility] Provider.  "The purpose of this phone visit is to provide behavioral health care while limiting exposure to the coronavirus (COVID19).  There is a possibility of technology failure and discussed alternative modes of communication if that failure occurs."  "By engaging in this telephone visit, you consent to the provision of healthcare.  Additionally, you authorize for your insurance to be billed for the services provided during this telephone visit."   Patient and/or legal guardian consented to telephone visit: Yes   PRESENTING CONCERNS: Patient and/or family reports the following symptoms/concerns: Pt reports diagnoses of depression, anxiety, PTSD, and Borderline Personality Disorder (2013) He has hx of participation in behavioral health services. Feels hopeless; however, denies suicidal or homicidal ideations Triggers include chronic pain and reports low communication with pain management doctor. Pt reports withdrawn behavior noting that he feels safer in the home. Hx of participation in AA Pt is grieving the loss of estranged father and two infant children. Pt shared prescribed pain meds doesn't work Duration of problem: Ongoing; Severity of problem: severe  STRENGTHS (Protective Factors/Coping Skills): Pt  has good insight Pt resides with adult daughter   GOALS ADDRESSED: Patient will: 1.  Reduce symptoms of: agitation, anxiety, depression and stress  2.  Increase knowledge and/or ability of: coping skills and healthy habits  3.  Demonstrate ability to: Increase healthy adjustment to current life circumstances and Increase adequate support systems for patient/family  INTERVENTIONS: Interventions utilized:  Solution-Focused Strategies, Supportive Counseling and Psychoeducation and/or Health Education Standardized Assessments completed: Not Needed  ASSESSMENT: Patient currently experiencing increase in depression and anxiety symptoms triggered by chronic pain, trauma, and psychosocial stressors. Pt scored positive on most recent phq9; however, denies current suicidal or homicidal ideations. Reports no plan or intent to harm self or others.    Patient may benefit from medication management and therapy. Therapeutic strategies discussed to assist with management and/or decrease in symptoms. Pt reports no barriers to conducting virtual visits. Crisis intervention resources provided.   PLAN: 1. Follow up with behavioral health clinician on : 09/19/19 2. Behavioral recommendations: Utilize strategies discussed and comply with medication management 3. Referral(s): [facility] (In Clinic)  Bridgett Larsson, [place] 09/23/2019 3:09 PM

## 2019-09-16 ENCOUNTER — Other Ambulatory Visit: Payer: Self-pay

## 2019-09-16 DIAGNOSIS — F339 Major depressive disorder, recurrent, unspecified: Secondary | ICD-10-CM

## 2019-09-16 DIAGNOSIS — E119 Type 2 diabetes mellitus without complications: Secondary | ICD-10-CM

## 2019-09-16 DIAGNOSIS — I10 Essential (primary) hypertension: Secondary | ICD-10-CM

## 2019-09-16 MED ORDER — SERTRALINE HCL 100 MG PO TABS: 100.0000 mg | ORAL_TABLET | Freq: Every day | ORAL | 1 refills | Status: DC

## 2019-09-16 MED ORDER — BUSPIRONE HCL 10 MG PO TABS: 20.0000 mg | ORAL_TABLET | Freq: Two times a day (BID) | ORAL | 1 refills | Status: DC

## 2019-09-16 MED ORDER — VALSARTAN-HYDROCHLOROTHIAZIDE 160-25 MG PO TABS: 1.0000 | ORAL_TABLET | Freq: Every day | ORAL | 1 refills | Status: DC

## 2019-09-16 MED ORDER — METFORMIN HCL 500 MG PO TABS: 500.0000 mg | ORAL_TABLET | Freq: Two times a day (BID) | ORAL | 1 refills | Status: DC

## 2019-09-17 ENCOUNTER — Other Ambulatory Visit: Payer: Self-pay | Admitting: Internal Medicine

## 2019-09-17 DIAGNOSIS — E119 Type 2 diabetes mellitus without complications: Secondary | ICD-10-CM

## 2019-09-17 DIAGNOSIS — R7989 Other specified abnormal findings of blood chemistry: Secondary | ICD-10-CM

## 2019-09-17 LAB — TESTOSTERONE,FREE AND TOTAL
Testosterone, Free: 1.3 pg/mL — ABNORMAL LOW (ref 7.2–24.0)
Testosterone: 65 ng/dL — ABNORMAL LOW (ref 264–916)

## 2019-09-17 LAB — HEMOGLOBIN A1C
Est. average glucose Bld gHb Est-mCnc: 197 mg/dL
Hgb A1c MFr Bld: 8.5 % — ABNORMAL HIGH (ref 4.8–5.6)

## 2019-09-17 MED ORDER — METFORMIN HCL 1000 MG PO TABS: 1000.0000 mg | ORAL_TABLET | Freq: Two times a day (BID) | ORAL | 0 refills | Status: DC

## 2019-09-17 MED ORDER — TESTOSTERONE CYPIONATE 100 MG/ML IM SOLN: 100.0000 mg | INTRAMUSCULAR | 0 refills | Status: DC

## 2019-09-17 MED ORDER — "BD ECLIPSE SYRINGE 22G X 1-1/2"" 3 ML MISC": 0 refills | Status: DC

## 2019-09-18 DIAGNOSIS — G894 Chronic pain syndrome: Secondary | ICD-10-CM | POA: Diagnosis not present

## 2019-09-18 DIAGNOSIS — R5382 Chronic fatigue, unspecified: Secondary | ICD-10-CM | POA: Diagnosis not present

## 2019-09-18 DIAGNOSIS — M5136 Other intervertebral disc degeneration, lumbar region: Secondary | ICD-10-CM | POA: Diagnosis not present

## 2019-09-18 DIAGNOSIS — M25559 Pain in unspecified hip: Secondary | ICD-10-CM | POA: Diagnosis not present

## 2019-09-18 DIAGNOSIS — M47897 Other spondylosis, lumbosacral region: Secondary | ICD-10-CM | POA: Diagnosis not present

## 2019-09-18 DIAGNOSIS — M5030 Other cervical disc degeneration, unspecified cervical region: Secondary | ICD-10-CM | POA: Diagnosis not present

## 2019-09-18 DIAGNOSIS — Z79891 Long term (current) use of opiate analgesic: Secondary | ICD-10-CM | POA: Diagnosis not present

## 2019-09-18 DIAGNOSIS — M79609 Pain in unspecified limb: Secondary | ICD-10-CM | POA: Diagnosis not present

## 2019-09-18 DIAGNOSIS — E1140 Type 2 diabetes mellitus with diabetic neuropathy, unspecified: Secondary | ICD-10-CM | POA: Diagnosis not present

## 2019-09-18 DIAGNOSIS — J449 Chronic obstructive pulmonary disease, unspecified: Secondary | ICD-10-CM | POA: Diagnosis not present

## 2019-09-18 DIAGNOSIS — G4733 Obstructive sleep apnea (adult) (pediatric): Secondary | ICD-10-CM | POA: Diagnosis not present

## 2019-09-18 DIAGNOSIS — M9951 Intervertebral disc stenosis of neural canal of cervical region: Secondary | ICD-10-CM | POA: Diagnosis not present

## 2019-09-18 DIAGNOSIS — M9931 Osseous stenosis of neural canal of cervical region: Secondary | ICD-10-CM | POA: Diagnosis not present

## 2019-09-18 DIAGNOSIS — M48062 Spinal stenosis, lumbar region with neurogenic claudication: Secondary | ICD-10-CM | POA: Diagnosis not present

## 2019-09-18 DIAGNOSIS — R519 Headache, unspecified: Secondary | ICD-10-CM | POA: Diagnosis not present

## 2019-09-19 ENCOUNTER — Ambulatory Visit: Payer: Medicare Other | Attending: Family Medicine | Admitting: Licensed Clinical Social Worker

## 2019-09-19 DIAGNOSIS — F332 Major depressive disorder, recurrent severe without psychotic features: Secondary | ICD-10-CM

## 2019-09-19 NOTE — BH Specialist Note (Signed)
Integrated Behavioral Health Visit via Telemedicine (Telephone)  09/19/2019 Aaron Giles [id number]   Session Start time: 4:00 PM  Session End time: 4:20 PM Total time: 20  Referring Provider: Dr. Earlene Plater Type of Visit: Telephonic Patient location: Home [facility] Provider location: Office All persons participating in visit: LCSW and Patient  Confirmed patient's address: Yes  Confirmed patient's phone number: Yes  Any changes to demographics: No   Confirmed patient's insurance: Yes  Any changes to patient's insurance: No   Discussed confidentiality: Yes    The following statements were read to the patient and/or legal guardian that are established with the [facility] Provider.  "The purpose of this phone visit is to provide behavioral health care while limiting exposure to the coronavirus (COVID19).  There is a possibility of technology failure and discussed alternative modes of communication if that failure occurs."  "By engaging in this telephone visit, you consent to the provision of healthcare.  Additionally, you authorize for your insurance to be billed for the services provided during this telephone visit."   Patient and/or legal guardian consented to telephone visit: Yes   PRESENTING CONCERNS: Patient and/or family reports the following symptoms/concerns: Pt reports that he hasn't heard from psychiatry referral or sleep study. Last week pt reports fluctuating emotions and irritability. Very interested in medication management. Been on current meds since 2017. Recently approved for disability due to diagnosis of Depression, Anxiety, PTSD. ADHD, and Borderline Personality Disorder Hx of intensive outpatient therapy Experiencing chronic pain spends a lot of time by himself. Resides with adult daughter and friends reside in [place] Duration of problem: Ongoing "all my life"; Severity of problem: severe Pt endorses suicidal ideations without plan or intent of harming self  or others.   STRENGTHS (Protective Factors/Coping Skills): Pt is open to behavioral health services Pt has desire to change Pt has stable housing  GOALS ADDRESSED: Patient will: 1.  Reduce symptoms of: agitation, anxiety, depression and stress  2.  Increase knowledge and/or ability of: self-management skills  3.  Demonstrate ability to: Increase healthy adjustment to current life circumstances and Increase adequate support systems for patient/family  INTERVENTIONS: Interventions utilized:  Solution-Focused Strategies, Supportive Counseling and Link to [facility] Standardized Assessments completed: Not Needed  ASSESSMENT: Patient currently experiencing difficulty managing depression and anxiety symptoms. Endorses passive suicidal ideations without plan or intent.    Patient has been referred to psychiatry to assist with management of symptoms. LCSW will contact Referral Coordinator at [facility] to follow up on referral. LCSW informed patient about [facility] and strongly encouraged him to be evaluated by psychiatrist. Pt verbalized understanding and stated that he will visit during walk-in hours on Fridays stating building is close to his residence.   PLAN: 1. Follow up with behavioral health clinician on : Contact LCSW with additional behavioral health and/or resource needs 2. Behavioral recommendations: Utilize healthy coping skills and follow up with [facility] 3. Referral(s): [facility] (In Clinic)  Bridgett Larsson, [place] 09/26/2019 12:46 PM

## 2019-10-01 NOTE — Telephone Encounter (Signed)
I called  sleep study  and they are going to contact the patient to schedule an appointment  . Therapist  Psychiatry  Ella Bodo sent the referral to jasmine  she saw the patient on 7/16 & 7/22  and  I sent the referral today to [facility] .

## 2019-10-07 DIAGNOSIS — M48061 Spinal stenosis, lumbar region without neurogenic claudication: Secondary | ICD-10-CM | POA: Diagnosis not present

## 2019-10-13 DIAGNOSIS — G894 Chronic pain syndrome: Secondary | ICD-10-CM | POA: Diagnosis not present

## 2019-10-16 DIAGNOSIS — G894 Chronic pain syndrome: Secondary | ICD-10-CM | POA: Diagnosis not present

## 2019-11-13 DIAGNOSIS — Z79891 Long term (current) use of opiate analgesic: Secondary | ICD-10-CM | POA: Diagnosis not present

## 2019-11-13 DIAGNOSIS — M5030 Other cervical disc degeneration, unspecified cervical region: Secondary | ICD-10-CM | POA: Diagnosis not present

## 2019-11-13 DIAGNOSIS — M5136 Other intervertebral disc degeneration, lumbar region: Secondary | ICD-10-CM | POA: Diagnosis not present

## 2019-11-13 DIAGNOSIS — G894 Chronic pain syndrome: Secondary | ICD-10-CM | POA: Diagnosis not present

## 2019-11-14 ENCOUNTER — Encounter: Payer: Self-pay | Admitting: Internal Medicine

## 2019-11-14 ENCOUNTER — Telehealth (INDEPENDENT_AMBULATORY_CARE_PROVIDER_SITE_OTHER): Payer: Medicare Other | Admitting: Internal Medicine

## 2019-11-14 DIAGNOSIS — F339 Major depressive disorder, recurrent, unspecified: Secondary | ICD-10-CM

## 2019-11-14 DIAGNOSIS — F411 Generalized anxiety disorder: Secondary | ICD-10-CM | POA: Diagnosis not present

## 2019-11-14 DIAGNOSIS — G4733 Obstructive sleep apnea (adult) (pediatric): Secondary | ICD-10-CM

## 2019-11-14 MED ORDER — BUPROPION HCL ER (XL) 150 MG PO TB24: 150.0000 mg | ORAL_TABLET | Freq: Every day | ORAL | 2 refills | Status: DC

## 2019-11-14 NOTE — Progress Notes (Signed)
Virtual Visit via Telephone Note  I connected with Aaron Giles, on 11/14/2019 at 1:52 PM by telephone due to the COVID-19 pandemic and verified that I am speaking with the correct person using two identifiers.   Consent: I discussed the limitations, risks, security and privacy concerns of performing an evaluation and management service by telephone and the availability of in person appointments. I also discussed with the patient that there may be a patient responsible charge related to this service. The patient expressed understanding and agreed to proceed.   Location of Patient: Home   Location of Provider: Clinic   Persons participating in Telemedicine visit: Grey Wellen Montel Clock [facility] Dr. Earlene Plater   History of Present Illness: Patient has a visit to f/u. Patient takes Buspar 20 mg BID and Zoloft 100 mg daily. Has been on these medications for long period of time. At last visit, referral for psych was placed. Patient also had two follow up visits with Jenel Lucks, LCSW for interim counseling. Patient was told by psych that they were full and they redirected him to [facility]. He had an awful experience where he felt like they were blowing him off. Additionally, there was poor connection and when they were preparing to switch him to Prozac the phone completely disconnected and his medication was never sent.   GAD 7 : Generalized Anxiety Score 11/14/2019 09/12/2019 05/21/2019  Nervous, Anxious, on Edge 1 3 1   Control/stop worrying 1 3 2   Worry too much - different things 2 3 2   Trouble relaxing 2 3 2   Restless 1 3 2   Easily annoyed or irritable 1 3 2   Afraid - awful might happen 0 3 3  Total GAD 7 Score 8 21 14   Anxiety Difficulty - - Very difficult   Depression screen [facility] 2/9 11/14/2019 09/12/2019 05/21/2019  Decreased Interest 2 3 3   Down, Depressed, Hopeless - 3 3  PHQ - 2 Score 2 6 6   Altered sleeping 3 3 2   Tired, decreased energy 2 3 3   Change in appetite 0  3 2  Feeling bad or failure about yourself  1 3 2   Trouble concentrating 0 3 3  Moving slowly or fidgety/restless 0 0 2  Suicidal thoughts 0 3 2  PHQ-9 Score 8 24 22   Difficult doing work/chores - - Very difficult     Past Medical History:  Diagnosis Date  . ADHD   . Anxiety   . Arthritis   . Carpal tunnel syndrome on right   . Cellulitis   . Cervical spondylosis   . Chronic hepatitis C (HCC)   . Chronic pain syndrome   . COPD (chronic obstructive pulmonary disease) (HCC)   . Depression   . Dyslipidemia associated with type 2 diabetes mellitus (HCC)   . GERD (gastroesophageal reflux disease)   . History of colonic polyps   . History of umbilical hernia   . Hypertension   . Hypogonadotropic hypogonadism (HCC)   . Lumbar spondylosis   . Neuromuscular disorder (HCC)   . OSA (obstructive sleep apnea)   . Peripheral autonomic neuropathy   . Peripheral edema   . Type 2 diabetes mellitus with diabetic neuropathy (HCC)   . Venous stasis    No Known Allergies  Current Outpatient Medications on File Prior to Visit  Medication Sig Dispense Refill  . B-D 3CC LUER-LOK SYR 18GX1-1/2 18G X 1-1/2" 3 ML MISC USE TO ADMINISTER TESTOSTERONE EVERY 14 DAYS.    busPIRone (BUSPAR) 10 MG tablet Take  2 tablets (20 mg total) by mouth 2 (two) times daily. 360 tablet 1  . cyclobenzaprine (FLEXERIL) 10 MG tablet Take 1 tablet (10 mg total) by mouth 3 (three) times daily as needed for muscle spasms. 30 tablet 0  . HYDROcodone-acetaminophen (NORCO) 10-325 MG tablet Take 1 tablet by mouth 3 (three) times daily as needed.    . meloxicam (MOBIC) 15 MG tablet TAKE 1/2 TO 1 TABLET(7.5 TO 15 MG) BY MOUTH DAILY AS NEEDED FOR PAIN 30 tablet 6  . metFORMIN (GLUCOPHAGE) 1000 MG tablet Take 1 tablet (1,000 mg total) by mouth 2 (two) times daily with a meal. 180 tablet 0  . nabumetone (RELAFEN) 750 MG tablet TAKE 1 TABLET(750 MG) BY MOUTH TWICE DAILY AS NEEDED 60 tablet 6  . pregabalin (LYRICA) 150 MG capsule  Take 1 capsule (150 mg total) by mouth 2 (two) times daily. 60 capsule 0  . rosuvastatin (CRESTOR) 10 MG tablet TAKE 1 TABLET BY MOUTH EVERY DAY TO LOWER CHOLESTEROL 90 tablet 0  . sertraline (ZOLOFT) 100 MG tablet Take 1 tablet (100 mg total) by mouth daily. 90 tablet 1  . testosterone cypionate (DEPOTESTOSTERONE CYPIONATE) 200 MG/ML injection Inject 200 mg into the muscle every 14 (fourteen) days.    . valsartan-hydrochlorothiazide (DIOVAN-HCT) 160-25 MG tablet Take 1 tablet by mouth daily. 90 tablet 1   No current facility-administered medications on file prior to visit.    Observations/Objective: NAD. Speaking clearly.  Work of breathing normal.  Alert and oriented. Mood appropriate.   Assessment and Plan: 1. GAD (generalized anxiety disorder) GAD-7 score is improved. Continue Buspar.  - Ambulatory referral to Psychiatry  2. Major depression, recurrent, chronic (HCC) PHQ-9 score is improved but patient still having depressive symptoms. Wants to try switching SSRIs vs. Adjunctive therapy. Start Wellbutrin 150 mg XL especially given has had some mild sexual side effects. Will have patient return in 2 months to assess new therapy. No safety concerns. New psych referral placed in attempt to establish patient with counseling.  - Ambulatory referral to Psychiatry - buPROPion (WELLBUTRIN XL) 150 MG 24 hr tablet; Take 1 tablet (150 mg total) by mouth daily.  Dispense: 30 tablet; Refill: 2  3. Obstructive sleep apnea syndrome Have been unable to reach sleep center regarding sleep study. Will place order for home sleep test.  - Home sleep test   Follow Up Instructions: 2 month f/u for GAD/MDD   I discussed the assessment and treatment plan with the patient. The patient was provided an opportunity to ask questions and all were answered. The patient agreed with the plan and demonstrated an understanding of the instructions.   The patient was advised to call back or seek an in-person  evaluation if the symptoms worsen or if the condition fails to improve as anticipated.     I provided 24 minutes total of non-face-to-face time during this encounter including median intraservice time, reviewing previous notes, investigations, ordering medications, medical decision making, coordinating care and patient verbalized understanding at the end of the visit.    Marcy Siren, D.O. Primary Care at [facility]  11/14/2019, 1:52 PM

## 2019-11-25 ENCOUNTER — Telehealth: Payer: Self-pay | Admitting: Licensed Clinical Social Worker

## 2019-11-25 ENCOUNTER — Telehealth: Payer: Self-pay | Admitting: Internal Medicine

## 2019-11-25 ENCOUNTER — Other Ambulatory Visit: Payer: Self-pay

## 2019-11-25 DIAGNOSIS — F411 Generalized anxiety disorder: Secondary | ICD-10-CM

## 2019-11-25 DIAGNOSIS — M47816 Spondylosis without myelopathy or radiculopathy, lumbar region: Secondary | ICD-10-CM | POA: Diagnosis not present

## 2019-11-25 DIAGNOSIS — F339 Major depressive disorder, recurrent, unspecified: Secondary | ICD-10-CM

## 2019-11-25 DIAGNOSIS — M961 Postlaminectomy syndrome, not elsewhere classified: Secondary | ICD-10-CM | POA: Diagnosis not present

## 2019-11-25 NOTE — Telephone Encounter (Signed)
Help pt get treatment.

## 2019-11-25 NOTE — Telephone Encounter (Signed)
Call placed to patient regarding follow up regarding psych referral. LCSW left message requesting a return call.

## 2019-12-05 NOTE — Telephone Encounter (Signed)
Follow up call placed to patient. Patient shared negative experience with [facility], an external referral provided by [facility]. LCSW informed patient of [facility] and pt requested an internal referral to be completed by PCP.   Pt reports compliance with updated medications stating, "I feel less anxious" He denies any side effects and is open to initiating psychotherapy through [facility] while PCP manages medications. LCSW strongly encouraged patient to contact clinic with any additional behavioral health and/or resource needs. Pt appreciative for the assistance and follow-up. No additional concerns noted.

## 2019-12-06 NOTE — Telephone Encounter (Signed)
I called [facility] on [address] to follow up on the referral that was placed on 11/25/2019. Was informed that it was sent to the main location because they are trying to keep their schedule open for uninsured/indigent population & patients with Medicaid [place]. Would you like to try an external location? Both internal  locations are booked out until December 2021.

## 2019-12-09 NOTE — Telephone Encounter (Signed)
Sure, lets try an external location. Do I need to drop down a new referral? If so, anything else specific that needs to go with it?   Marcy Siren, D.O. Primary Care at [facility]  12/09/2019, 9:35 AM

## 2019-12-11 DIAGNOSIS — M5136 Other intervertebral disc degeneration, lumbar region: Secondary | ICD-10-CM | POA: Diagnosis not present

## 2019-12-11 DIAGNOSIS — Z79891 Long term (current) use of opiate analgesic: Secondary | ICD-10-CM | POA: Diagnosis not present

## 2019-12-11 DIAGNOSIS — M5030 Other cervical disc degeneration, unspecified cervical region: Secondary | ICD-10-CM | POA: Diagnosis not present

## 2019-12-11 DIAGNOSIS — G894 Chronic pain syndrome: Secondary | ICD-10-CM | POA: Diagnosis not present

## 2019-12-11 NOTE — Telephone Encounter (Signed)
Referral sent to [facility].

## 2019-12-12 DIAGNOSIS — G894 Chronic pain syndrome: Secondary | ICD-10-CM | POA: Diagnosis not present

## 2019-12-23 ENCOUNTER — Other Ambulatory Visit: Payer: Self-pay

## 2019-12-23 ENCOUNTER — Ambulatory Visit (HOSPITAL_BASED_OUTPATIENT_CLINIC_OR_DEPARTMENT_OTHER): Payer: Medicare Other | Attending: Internal Medicine | Admitting: Internal Medicine

## 2019-12-23 VITALS — Ht 72.0 in | Wt 309.0 lb

## 2019-12-23 DIAGNOSIS — G4761 Periodic limb movement disorder: Secondary | ICD-10-CM | POA: Insufficient documentation

## 2019-12-23 DIAGNOSIS — G4736 Sleep related hypoventilation in conditions classified elsewhere: Secondary | ICD-10-CM | POA: Insufficient documentation

## 2019-12-23 DIAGNOSIS — G4734 Idiopathic sleep related nonobstructive alveolar hypoventilation: Secondary | ICD-10-CM

## 2019-12-23 DIAGNOSIS — G4733 Obstructive sleep apnea (adult) (pediatric): Secondary | ICD-10-CM | POA: Insufficient documentation

## 2020-01-08 DIAGNOSIS — M5136 Other intervertebral disc degeneration, lumbar region: Secondary | ICD-10-CM | POA: Diagnosis not present

## 2020-01-08 DIAGNOSIS — M5030 Other cervical disc degeneration, unspecified cervical region: Secondary | ICD-10-CM | POA: Diagnosis not present

## 2020-01-08 DIAGNOSIS — G894 Chronic pain syndrome: Secondary | ICD-10-CM | POA: Diagnosis not present

## 2020-01-08 DIAGNOSIS — Z79891 Long term (current) use of opiate analgesic: Secondary | ICD-10-CM | POA: Diagnosis not present

## 2020-01-09 ENCOUNTER — Ambulatory Visit (INDEPENDENT_AMBULATORY_CARE_PROVIDER_SITE_OTHER): Payer: Medicare Other | Admitting: Physician Assistant

## 2020-01-09 ENCOUNTER — Encounter: Payer: Self-pay | Admitting: Physician Assistant

## 2020-01-09 ENCOUNTER — Other Ambulatory Visit: Payer: Self-pay

## 2020-01-09 ENCOUNTER — Other Ambulatory Visit: Payer: Self-pay | Admitting: *Deleted

## 2020-01-09 VITALS — BP 166/97 | HR 86 | Temp 97.4°F | Resp 18 | Ht 72.0 in | Wt 302.0 lb

## 2020-01-09 DIAGNOSIS — R7989 Other specified abnormal findings of blood chemistry: Secondary | ICD-10-CM

## 2020-01-09 DIAGNOSIS — F339 Major depressive disorder, recurrent, unspecified: Secondary | ICD-10-CM

## 2020-01-09 DIAGNOSIS — F411 Generalized anxiety disorder: Secondary | ICD-10-CM | POA: Diagnosis not present

## 2020-01-09 DIAGNOSIS — E559 Vitamin D deficiency, unspecified: Secondary | ICD-10-CM | POA: Diagnosis not present

## 2020-01-09 DIAGNOSIS — E119 Type 2 diabetes mellitus without complications: Secondary | ICD-10-CM

## 2020-01-09 MED ORDER — "NEEDLE (DISP) 30G X 1"" MISC": 1.0000 | 0 refills | Status: AC

## 2020-01-09 MED ORDER — BUPROPION HCL ER (XL) 300 MG PO TB24: 300.0000 mg | ORAL_TABLET | Freq: Every day | ORAL | 1 refills | Status: DC

## 2020-01-09 NOTE — Progress Notes (Signed)
Established Patient Office Visit  Subjective:  Patient ID: Aaron Giles, male    DOB: [birth year]  Age: 59 y.o. MRN: [id number]  CC:  Chief Complaint  Patient presents with  . Anxiety    HPI Aaron Giles reports that he feels the Wellbutrin is offering some relief but does feel his anxiety could still be better controlled.  Has not heard from the referral for psychiatry.  Reports that he is using the testosterone as directed, does request a smaller gauge needle for injection.  States that he did complete his sleep study but has not learned of the results.  Reports that he still continues to have slight nausea on occasion after taking the increased dose of Metformin, but states he is working hard to improve his diet as well.  Reports blood glucose readings 110-118 on average  Reports that he has been taking hypertension medications as directed, denies any adverse effects, states that blood pressure readings at home have been within normal limits    Past Medical History:  Diagnosis Date  . ADHD   . Anxiety   . Arthritis   . Carpal tunnel syndrome on right   . Cellulitis   . Cervical spondylosis   . Chronic hepatitis C (HCC)   . Chronic pain syndrome   . COPD (chronic obstructive pulmonary disease) (HCC)   . Depression   . Dyslipidemia associated with type 2 diabetes mellitus (HCC)   . GERD (gastroesophageal reflux disease)   . History of colonic polyps   . History of umbilical hernia   . Hypertension   . Hypogonadotropic hypogonadism (HCC)   . Lumbar spondylosis   . Neuromuscular disorder (HCC)   . OSA (obstructive sleep apnea)   . Peripheral autonomic neuropathy   . Peripheral edema   . Type 2 diabetes mellitus with diabetic neuropathy (HCC)   . Venous stasis     Past Surgical History:  Procedure Laterality Date  . APPENDECTOMY    . groin abscess Right 2017  . POSTERIOR LUMBAR FUSION     L3-4 fusion  . removal of cyst from testicle Right   . SMALL  INTESTINE SURGERY    . UMBILICAL HERNIA REPAIR      Family History  Adopted: Yes    Social History   Socioeconomic History  . Marital status: Divorced    Spouse name: Not on file  . Number of children: Not on file  . Years of education: Not on file  . Highest education level: Not on file  Occupational History  . Not on file  Tobacco Use  . Smoking status: Current Every Day Smoker    Packs/day: 0.50  . Smokeless tobacco: Never Used  Vaping Use  . Vaping Use: Never used  Substance and Sexual Activity  . Alcohol use: Not Currently  . Drug use: Never  . Sexual activity: Not Currently  Other Topics Concern  . Not on file  Social History Narrative  . Not on file   Social Determinants of Health   Financial Resource Strain:   . Difficulty of Paying Living Expenses: Not on file  Food Insecurity:   . Worried About Programme researcher, broadcasting/film/videounning Out of Food in the Last Year: Not on file  . Ran Out of Food in the Last Year: Not on file  Transportation Needs:   . Lack of Transportation (Medical): Not on file  . Lack of Transportation (Non-Medical): Not on file  Physical Activity:   . Days of Exercise per Week: Not on  file  . Minutes of Exercise per Session: Not on file  Stress:   . Feeling of Stress : Not on file  Social Connections:   . Frequency of Communication with Friends and Family: Not on file  . Frequency of Social Gatherings with Friends and Family: Not on file  . Attends Religious Services: Not on file  . Active Member of Clubs or Organizations: Not on file  . Attends Banker Meetings: Not on file  . Marital Status: Not on file  Intimate Partner Violence:   . Fear of Current or Ex-Partner: Not on file  . Emotionally Abused: Not on file  . Physically Abused: Not on file  . Sexually Abused: Not on file    Outpatient Medications Prior to Visit  Medication Sig Dispense Refill  . B-D 3CC LUER-LOK SYR 18GX1-1/2 18G X 1-1/2" 3 ML MISC USE TO ADMINISTER TESTOSTERONE EVERY  14 DAYS.    Marland Kitchen busPIRone (BUSPAR) 10 MG tablet Take 2 tablets (20 mg total) by mouth 2 (two) times daily. 360 tablet 1  . cyclobenzaprine (FLEXERIL) 10 MG tablet Take 1 tablet (10 mg total) by mouth 3 (three) times daily as needed for muscle spasms. 30 tablet 0  . HYDROcodone-acetaminophen (NORCO) 10-325 MG tablet Take 1 tablet by mouth 3 (three) times daily as needed.    . meloxicam (MOBIC) 15 MG tablet TAKE 1/2 TO 1 TABLET(7.5 TO 15 MG) BY MOUTH DAILY AS NEEDED FOR PAIN 30 tablet 6  . metFORMIN (GLUCOPHAGE) 1000 MG tablet Take 1 tablet (1,000 mg total) by mouth 2 (two) times daily with a meal. 180 tablet 0  . nabumetone (RELAFEN) 750 MG tablet TAKE 1 TABLET(750 MG) BY MOUTH TWICE DAILY AS NEEDED 60 tablet 6  . pregabalin (LYRICA) 150 MG capsule Take 1 capsule (150 mg total) by mouth 2 (two) times daily. 60 capsule 0  . rosuvastatin (CRESTOR) 10 MG tablet TAKE 1 TABLET BY MOUTH EVERY DAY TO LOWER CHOLESTEROL 90 tablet 0  . sertraline (ZOLOFT) 100 MG tablet Take 1 tablet (100 mg total) by mouth daily. 90 tablet 1  . testosterone cypionate (DEPOTESTOSTERONE CYPIONATE) 200 MG/ML injection Inject 200 mg into the muscle every 14 (fourteen) days.    . valsartan-hydrochlorothiazide (DIOVAN-HCT) 160-25 MG tablet Take 1 tablet by mouth daily. 90 tablet 1  . buPROPion (WELLBUTRIN XL) 150 MG 24 hr tablet Take 1 tablet (150 mg total) by mouth daily. 30 tablet 2   No facility-administered medications prior to visit.    No Known Allergies  ROS Review of Systems  Constitutional: Negative.   HENT: Negative.   Eyes: Negative.   Respiratory: Negative.   Cardiovascular: Negative.   Gastrointestinal: Positive for nausea.  Endocrine: Negative.   Genitourinary: Negative.   Musculoskeletal: Negative.   Skin: Negative.   Allergic/Immunologic: Negative.   Neurological: Negative.   Hematological: Negative.   Psychiatric/Behavioral: Negative for self-injury, sleep disturbance and suicidal ideas. The patient  is nervous/anxious.       Objective:    Physical Exam Vitals and nursing note reviewed.  Constitutional:      General: He is not in acute distress.    Appearance: Normal appearance. He is obese. He is not ill-appearing.  HENT:     Head: Normocephalic and atraumatic.     Right Ear: External ear normal.     Left Ear: External ear normal.     Nose: Nose normal.     Mouth/Throat:     Mouth: Mucous membranes are moist.  Pharynx: Oropharynx is clear.  Eyes:     Extraocular Movements: Extraocular movements intact.     Conjunctiva/sclera: Conjunctivae normal.     Pupils: Pupils are equal, round, and reactive to light.  Cardiovascular:     Rate and Rhythm: Normal rate and regular rhythm.     Pulses: Normal pulses.     Heart sounds: Normal heart sounds.  Pulmonary:     Effort: Pulmonary effort is normal.     Breath sounds: Normal breath sounds.  Abdominal:     General: Abdomen is flat.     Palpations: Abdomen is soft.  Musculoskeletal:        General: Normal range of motion.     Cervical back: Normal range of motion and neck supple.  Skin:    General: Skin is warm and dry.  Neurological:     General: No focal deficit present.     Mental Status: He is alert and oriented to person, place, and time.  Psychiatric:        Mood and Affect: Mood normal.        Behavior: Behavior normal.        Thought Content: Thought content normal.        Judgment: Judgment normal.     BP (!) 166/97 (BP Location: Right Arm, Patient Position: Sitting, Cuff Size: Normal)   Pulse 86   Temp (!) 97.4 F (36.3 C) (Oral)   Resp 18   Ht 6' (1.829 m)   Wt (!) 302 lb (137 kg)   SpO2 95%   BMI 40.96 kg/m  Wt Readings from Last 3 Encounters:  01/09/20 (!) 302 lb (137 kg)  12/23/19 (!) 309 lb (140.2 kg)  09/12/19 299 lb (135.6 kg)     Health Maintenance Due  Topic Date Due  . FOOT EXAM  Never done  . OPHTHALMOLOGY EXAM  Never done  . COVID-19 Vaccine (1) Never done  . COLONOSCOPY  Never  done  . INFLUENZA VACCINE  09/29/2019    There are no preventive care reminders to display for this patient.  Lab Results  Component Value Date   TSH 1.06 03/14/2019   Lab Results  Component Value Date   WBC 5.6 03/14/2019   HGB 13.9 03/14/2019   HCT 41.6 03/14/2019   MCV 94.8 03/14/2019   PLT 163.0 03/14/2019   Lab Results  Component Value Date   NA 146 (H) 01/29/2019   K 4.3 01/29/2019   CO2 26 01/29/2019   GLUCOSE 117 (H) 01/29/2019   BUN 15 01/29/2019   CREATININE 0.80 01/29/2019   BILITOT <0.2 01/29/2019   ALKPHOS 66 01/29/2019   AST 28 01/29/2019   ALT 36 01/29/2019   PROT 7.1 01/29/2019   ALBUMIN 4.5 01/29/2019   CALCIUM 9.0 01/29/2019   Lab Results  Component Value Date   CHOL 194 01/29/2019   Lab Results  Component Value Date   HDL 45 01/29/2019   Lab Results  Component Value Date   LDLCALC 109 (H) 01/29/2019   Lab Results  Component Value Date   TRIG 232 (H) 01/29/2019   Lab Results  Component Value Date   CHOLHDL 4.3 01/29/2019   Lab Results  Component Value Date   HGBA1C 8.5 (H) 09/12/2019      Assessment & Plan:   Problem List Items Addressed This Visit      Other   Low testosterone   Relevant Medications   NEEDLE, DISP, 30 G 30G X 1" MISC  Vitamin D deficiency   Relevant Orders   Vitamin D, 25-hydroxy    Other Visit Diagnoses    Type 2 diabetes mellitus without complication, without long-term current use of insulin (HCC)    -  Primary   Relevant Orders   CBC with Differential/Platelet   Comp. Metabolic Panel (12)   Lipid panel   Microalbumin / creatinine urine ratio   Testosterone,Free and Total   Major depression, recurrent, chronic (HCC)       Relevant Medications   buPROPion (WELLBUTRIN XL) 300 MG 24 hr tablet   GAD (generalized anxiety disorder)       Relevant Medications   buPROPion (WELLBUTRIN XL) 300 MG 24 hr tablet    1. Major depression, recurrent, chronic (HCC) Increase Wellbutrin XL 300 mg, patient given  contact information for psychiatry referral.  Patient education given on gene site DNA testing as well as TMS therapy   - buPROPion (WELLBUTRIN XL) 300 MG 24 hr tablet; Take 1 tablet (300 mg total) by mouth daily.  Dispense: 30 tablet; Refill: 1  2. Type 2 diabetes mellitus without complication, without long-term current use of insulin (HCC) A1c improved to 6.7.  Congratulated patient on effort, patient education given on Mediterranean style diet.  Patient to return for fasting labs first week of December, follow-up with Dr. Earlene Plater 2 weeks later. - CBC with Differential/Platelet; Future - Comp. Metabolic Panel (12); Future - Lipid panel; Future - Microalbumin / creatinine urine ratio; Future - Testosterone,Free and Total; Future  3. Low testosterone Continue current regimen - NEEDLE, DISP, 30 G 30G X 1" MISC; 1 each by Does not apply route every 14 (fourteen) days.  Dispense: 100 each; Refill: 0  4. GAD (generalized anxiety disorder)  5. Vitamin D deficiency  - Vitamin D, 25-hydroxy; Future   Meds ordered this encounter  Medications  . buPROPion (WELLBUTRIN XL) 300 MG 24 hr tablet    Sig: Take 1 tablet (300 mg total) by mouth daily.    Dispense:  30 tablet    Refill:  1    DOSE CHANGE    Order Specific Question:   Supervising Provider    Answer:   Shan Levans E [[id number]]  . NEEDLE, DISP, 30 G 30G X 1" MISC    Sig: 1 each by Does not apply route every 14 (fourteen) days.    Dispense:  100 each    Refill:  0    Order Specific Question:   Supervising Provider    Answer:   Storm Frisk [[id number]]   I have reviewed the patient's medical history (PMH, PSH, Social History, Family History, Medications, and allergies) , and have been updated if relevant. I spent 30 minutes reviewing chart and  face to face time with patient.     Follow-up: No follow-ups on file.    Kasandra Knudsen Mayers, PA-C

## 2020-01-09 NOTE — Patient Instructions (Addendum)
The name is the office is  [facility] [address], [place], [place] [address] Ph. [phone] Serita Sheller. [phone] The referral was sent 12/16/19  The name of the tests I spoke about were Genesight and Genomind  I increased your Wellbutrin XL to 300 mg  Please let me know if there is anything else we cal do for you  Roney Jaffe, PA-C Physician Assistant [facility] [email or web address]     Mediterranean Diet A Mediterranean diet refers to food and lifestyle choices that are based on the traditions of countries located on the [place] Sea. This way of eating has been shown to help prevent certain conditions and improve outcomes for people who have chronic diseases, like kidney disease and heart disease. What are tips for following this plan? Lifestyle  Cook and eat meals together with your family, when possible.  Drink enough fluid to keep your urine clear or pale yellow.  Be physically active every day. This includes: ? Aerobic exercise like running or swimming. ? Leisure activities like gardening, walking, or housework.  Get 7-8 hours of sleep each night.  If recommended by your health care provider, drink red wine in moderation. This means 1 glass a day for nonpregnant women and 2 glasses a day for men. A glass of wine equals 5 oz (150 mL). Reading food labels   Check the serving size of packaged foods. For foods such as rice and pasta, the serving size refers to the amount of cooked product, not dry.  Check the total fat in packaged foods. Avoid foods that have saturated fat or trans fats.  Check the ingredients list for added sugars, such as corn syrup. Shopping  At the grocery store, buy most of your food from the areas near the walls of the store. This includes: ? Fresh fruits and vegetables (produce). ? Grains, beans, nuts, and seeds. Some of these may be available in unpackaged  forms or large amounts (in bulk). ? Fresh seafood. ? Poultry and eggs. ? Low-fat dairy products.  Buy whole ingredients instead of prepackaged foods.  Buy fresh fruits and vegetables in-season from local farmers markets.  Buy frozen fruits and vegetables in resealable bags.  If you do not have access to quality fresh seafood, buy precooked frozen shrimp or canned fish, such as tuna, salmon, or sardines.  Buy small amounts of raw or cooked vegetables, salads, or olives from the deli or salad bar at your store.  Stock your pantry so you always have certain foods on hand, such as olive oil, canned tuna, canned tomatoes, rice, pasta, and beans. Cooking  Cook foods with extra-virgin olive oil instead of using butter or other vegetable oils.  Have meat as a side dish, and have vegetables or grains as your main dish. This means having meat in small portions or adding small amounts of meat to foods like pasta or stew.  Use beans or vegetables instead of meat in common dishes like chili or lasagna.  Experiment with different cooking methods. Try roasting or broiling vegetables instead of steaming or sauteing them.  Add frozen vegetables to soups, stews, pasta, or rice.  Add nuts or seeds for added healthy fat at each meal. You can add these to yogurt, salads, or vegetable dishes.  Marinate fish or vegetables using olive oil, lemon juice, garlic, and fresh herbs. Meal planning   Plan to eat 1 vegetarian meal one day each week. Try to work up to 2 vegetarian meals,  if possible.  Eat seafood 2 or more times a week.  Have healthy snacks readily available, such as: ? Vegetable sticks with hummus. ? [place] yogurt. ? Fruit and nut trail mix.  Eat balanced meals throughout the week. This includes: ? Fruit: 2-3 servings a day ? Vegetables: 4-5 servings a day ? Low-fat dairy: 2 servings a day ? Fish, poultry, or lean meat: 1 serving a day ? Beans and legumes: 2 or more servings a  week ? Nuts and seeds: 1-2 servings a day ? Whole grains: 6-8 servings a day ? Extra-virgin olive oil: 3-4 servings a day  Limit red meat and sweets to only a few servings a month What are my food choices?  Mediterranean diet ? Recommended  Grains: Whole-grain pasta. Brown rice. Bulgar wheat. Polenta. Couscous. Whole-wheat bread. Orpah Cobb.  Vegetables: Artichokes. Beets. Broccoli. Cabbage. Carrots. Eggplant. Green beans. Chard. Kale. Spinach. Onions. Leeks. Peas. Squash. Tomatoes. Peppers. Radishes.  Fruits: Apples. Apricots. Avocado. Berries. Bananas. Cherries. Dates. Figs. Grapes. Lemons. Melon. Oranges. Peaches. Plums. Pomegranate.  Meats and other protein foods: Beans. Almonds. Sunflower seeds. Pine nuts. Peanuts. Cod. Salmon. Scallops. Shrimp. Tuna. Tilapia. Clams. Oysters. Eggs.  Dairy: Low-fat milk. Cheese. Greek yogurt.  Beverages: Water. Red wine. Herbal tea.  Fats and oils: Extra virgin olive oil. Avocado oil. Grape seed oil.  Sweets and desserts: [place] yogurt with honey. Baked apples. Poached pears. Trail mix.  Seasoning and other foods: Basil. Cilantro. Coriander. Cumin. Mint. Parsley. Sage. Rosemary. Tarragon. Garlic. Oregano. Thyme. Pepper. Balsalmic vinegar. Tahini. Hummus. Tomato sauce. Olives. Mushrooms. ? Limit these  Grains: Prepackaged pasta or rice dishes. Prepackaged cereal with added sugar.  Vegetables: Deep fried potatoes (french fries).  Fruits: Fruit canned in syrup.  Meats and other protein foods: Beef. Pork. Lamb. Poultry with skin. Hot dogs. Tomasa Blase.  Dairy: Ice cream. Sour cream. Whole milk.  Beverages: Juice. Sugar-sweetened soft drinks. Beer. Liquor and spirits.  Fats and oils: Butter. Canola oil. Vegetable oil. Beef fat (tallow). Lard.  Sweets and desserts: Cookies. Cakes. Pies. Candy.  Seasoning and other foods: Mayonnaise. Premade sauces and marinades. The items listed may not be a complete list. Talk with your dietitian about  what dietary choices are right for you. Summary  The Mediterranean diet includes both food and lifestyle choices.  Eat a variety of fresh fruits and vegetables, beans, nuts, seeds, and whole grains.  Limit the amount of red meat and sweets that you eat.  Talk with your health care provider about whether it is safe for you to drink red wine in moderation. This means 1 glass a day for nonpregnant women and 2 glasses a day for men. A glass of wine equals 5 oz (150 mL). This information is not intended to replace advice given to you by your health care provider. Make sure you discuss any questions you have with your health care provider. Document Revised: 10/15/2015 Document Reviewed: 10/08/2015 Elsevier Patient Education  2020 [employer].

## 2020-01-10 MED ORDER — TESTOSTERONE CYPIONATE 200 MG/ML IM SOLN: 200.0000 mg | INTRAMUSCULAR | 1 refills | Status: DC

## 2020-01-11 DIAGNOSIS — G4733 Obstructive sleep apnea (adult) (pediatric): Secondary | ICD-10-CM

## 2020-01-11 DIAGNOSIS — G4734 Idiopathic sleep related nonobstructive alveolar hypoventilation: Secondary | ICD-10-CM

## 2020-01-11 DIAGNOSIS — G894 Chronic pain syndrome: Secondary | ICD-10-CM | POA: Diagnosis not present

## 2020-01-11 NOTE — Procedures (Signed)
Patient Name: Aaron Giles, Aaron Giles Date: 12/23/2019 Gender: Male D.O.B: [birth year] Age (years): 59 Referring Provider: De Hollingshead DO Height (inches): 72 Interpreting Physician: Jetty Duhamel MD, ABSM Weight (lbs): 309 RPSGT: Ulyess Mort BMI: 42 MRN: [id number] Neck Size: 18.00  CLINICAL INFORMATION Sleep Study Type: NPSG Indication for sleep study: COPD, Depression, Diabetes, Fatigue, Hypertension, Obesity, Snoring Epworth Sleepiness Score: 3  SLEEP STUDY TECHNIQUE As per the AASM Manual for the Scoring of Sleep and Associated Events v2.3 (April 2016) with a hypopnea requiring 4% desaturations.  The channels recorded and monitored were frontal, central and occipital EEG, electrooculogram (EOG), submentalis EMG (chin), nasal and oral airflow, thoracic and abdominal wall motion, anterior tibialis EMG, snore microphone, electrocardiogram, and pulse oximetry.  MEDICATIONS Medications self-administered by patient taken the night of the study : none reported  SLEEP ARCHITECTURE The study was initiated at 9:41:33 PM and ended at 4:36:29 AM.  Sleep onset time was 156.9 minutes and the sleep efficiency was 33.0%%. The total sleep time was 137 minutes.  Stage REM latency was N/A minutes.  The patient spent 49.3%% of the night in stage N1 sleep, 50.7%% in stage N2 sleep, 0.0%% in stage N3 and 0% in REM.  Alpha intrusion was absent.  Supine sleep was 0.00%.  RESPIRATORY PARAMETERS The overall apnea/hypopnea index (AHI) was 7.0 per hour. There were 1 total apneas, including 1 obstructive, 0 central and 0 mixed apneas. There were 15 hypopneas and 8 RERAs.  The AHI during Stage REM sleep was N/A per hour.  AHI while supine was N/A per hour.  The mean oxygen saturation was 90.0%. The minimum SpO2 during sleep was 85.0%.  moderate snoring was noted during this study.  CARDIAC DATA The 2 lead EKG demonstrated sinus rhythm. The mean heart rate was 67.2 beats per  minute. Other EKG findings include: PVCs.  LEG MOVEMENT DATA The total PLMS were 0 with a resulting PLMS index of 0.0. Associated arousal with leg movement index was 26.3 .  IMPRESSIONS - Mild obstructive sleep apnea occurred during this study (AHI = 7.0/h). - No significant central sleep apnea occurred during this study (CAI = 0.0/h). - Mild oxygen desaturation was noted during this study (Min O2 = 85.0%). Mean room air O2 sat 90%. - Time with O2 sat 88% or less was 17.6 minutes. - The patient snored with moderate snoring volume. - EKG findings include PVCs. - Clinically significant periodic limb movements did occur during sleep. Associated arousals were significant, averaging 26.3/ hr. - Study was performed with patient sleeping in recliner chair. Sustained sleeo onset 1:00AM. Sleep was markedly fragmented with frequent arousals and awakenings, absent REM.  DIAGNOSIS - Obstructive Sleep Apnea (G47.33) - Periodic Limb Movement During Sleep (G47.61) - Nocturnal Hypoxemia (G47.36)  RECOMMENDATIONS - Treatment for very mild OSA is directed at symptoms and conservative measures including observation, weight loss and sleep position off back may be enough. - CPAP may be appropriate, if it also improves the oxygen desaturation. If CPAP and supplemental O2 are both anticipated, then documentation rules will likely require an in-center CPAP titration study. - Consider trial of Mirapex, Requip, or Sinemet for treatment of Periodic Leg Movements of Sleep. - Sleep hygiene should be reviewed to assess factors that may improve sleep quality. - Weight management and regular exercise should be initiated or continued if appropriate.  [Electronically signed] 01/11/2020 11:40 AM  Jetty Duhamel MD, ABSM Diplomate, American Board of Sleep Medicine   NPI: [id number]  Jetty Duhamel Diplomate, Biomedical engineer of Sleep Medicine  ELECTRONICALLY SIGNED ON:   01/11/2020, 11:27 AM  [facility] PH: [phone]   FX: [phone] ACCREDITED BY THE AMERICAN ACADEMY OF SLEEP MEDICINE

## 2020-01-16 NOTE — Telephone Encounter (Signed)
I remember doing an A1C while him and provider were speaking but not a flu shot.

## 2020-01-20 ENCOUNTER — Other Ambulatory Visit: Payer: Self-pay

## 2020-01-20 DIAGNOSIS — G4734 Idiopathic sleep related nonobstructive alveolar hypoventilation: Secondary | ICD-10-CM

## 2020-01-20 DIAGNOSIS — G4733 Obstructive sleep apnea (adult) (pediatric): Secondary | ICD-10-CM

## 2020-01-20 NOTE — Progress Notes (Signed)
Thank you. Signed order.   Marcy Siren, D.O. Primary Care at [facility]  01/20/2020, 1:45 PM

## 2020-01-30 ENCOUNTER — Other Ambulatory Visit (INDEPENDENT_AMBULATORY_CARE_PROVIDER_SITE_OTHER): Payer: Medicare Other

## 2020-01-30 ENCOUNTER — Other Ambulatory Visit: Payer: Self-pay

## 2020-01-30 DIAGNOSIS — M47816 Spondylosis without myelopathy or radiculopathy, lumbar region: Secondary | ICD-10-CM | POA: Diagnosis not present

## 2020-01-30 DIAGNOSIS — E119 Type 2 diabetes mellitus without complications: Secondary | ICD-10-CM | POA: Diagnosis not present

## 2020-01-30 DIAGNOSIS — G8929 Other chronic pain: Secondary | ICD-10-CM | POA: Diagnosis not present

## 2020-01-30 DIAGNOSIS — E559 Vitamin D deficiency, unspecified: Secondary | ICD-10-CM | POA: Diagnosis not present

## 2020-01-30 DIAGNOSIS — M961 Postlaminectomy syndrome, not elsewhere classified: Secondary | ICD-10-CM | POA: Diagnosis not present

## 2020-01-31 LAB — COMP. METABOLIC PANEL (12)
AST: 35 IU/L (ref 0–40)
Albumin/Globulin Ratio: 1.8 (ref 1.2–2.2)
Albumin: 4.8 g/dL (ref 3.8–4.9)
Alkaline Phosphatase: 64 IU/L (ref 44–121)
BUN/Creatinine Ratio: 12 (ref 9–20)
BUN: 13 mg/dL (ref 6–24)
Bilirubin Total: 0.2 mg/dL (ref 0.0–1.2)
Calcium: 9.4 mg/dL (ref 8.7–10.2)
Chloride: 101 mmol/L (ref 96–106)
Creatinine, Ser: 1.06 mg/dL (ref 0.76–1.27)
GFR calc Af Amer: 89 mL/min/{1.73_m2} (ref >59)
GFR calc non Af Amer: 77 mL/min/{1.73_m2} (ref >59)
Globulin, Total: 2.6 g/dL (ref 1.5–4.5)
Glucose: 135 mg/dL — ABNORMAL HIGH (ref 65–99)
Potassium: 4.7 mmol/L (ref 3.5–5.2)
Sodium: 141 mmol/L (ref 134–144)
Total Protein: 7.4 g/dL (ref 6.0–8.5)

## 2020-01-31 LAB — CBC WITH DIFFERENTIAL/PLATELET
Basophils Absolute: 0 10*3/uL (ref 0.0–0.2)
Basos: 1 %
EOS (ABSOLUTE): 0.1 10*3/uL (ref 0.0–0.4)
Eos: 1 %
Hematocrit: 45.7 % (ref 37.5–51.0)
Hemoglobin: 15.5 g/dL (ref 13.0–17.7)
Immature Grans (Abs): 0 10*3/uL (ref 0.0–0.1)
Immature Granulocytes: 1 %
Lymphocytes Absolute: 1.9 10*3/uL (ref 0.7–3.1)
Lymphs: 29 %
MCH: 31.1 pg (ref 26.6–33.0)
MCHC: 33.9 g/dL (ref 31.5–35.7)
MCV: 92 fL (ref 79–97)
Monocytes Absolute: 0.8 10*3/uL (ref 0.1–0.9)
Monocytes: 12 %
Neutrophils Absolute: 3.9 10*3/uL (ref 1.4–7.0)
Neutrophils: 56 %
Platelets: 226 10*3/uL (ref 150–450)
RBC: 4.98 x10E6/uL (ref 4.14–5.80)
RDW: 13 % (ref 11.6–15.4)
WBC: 6.7 10*3/uL (ref 3.4–10.8)

## 2020-01-31 LAB — MICROALBUMIN / CREATININE URINE RATIO
Creatinine, Urine: 144.9 mg/dL
Microalb/Creat Ratio: 13 mg/g creat (ref 0–29)
Microalbumin, Urine: 18.6 ug/mL

## 2020-01-31 LAB — LIPID PANEL
Chol/HDL Ratio: 7.7 ratio — ABNORMAL HIGH (ref 0.0–5.0)
Cholesterol, Total: 255 mg/dL — ABNORMAL HIGH (ref 100–199)
HDL: 33 mg/dL — ABNORMAL LOW (ref >39)
LDL Chol Calc (NIH): 141 mg/dL — ABNORMAL HIGH (ref 0–99)
Triglycerides: 438 mg/dL — ABNORMAL HIGH (ref 0–149)
VLDL Cholesterol Cal: 81 mg/dL — ABNORMAL HIGH (ref 5–40)

## 2020-01-31 LAB — TESTOSTERONE,FREE AND TOTAL
Testosterone, Free: 10.6 pg/mL (ref 7.2–24.0)
Testosterone: 354 ng/dL (ref 264–916)

## 2020-01-31 LAB — VITAMIN D 25 HYDROXY (VIT D DEFICIENCY, FRACTURES): Vit D, 25-Hydroxy: 13 ng/mL — ABNORMAL LOW (ref 30.0–100.0)

## 2020-02-03 DIAGNOSIS — E785 Hyperlipidemia, unspecified: Secondary | ICD-10-CM

## 2020-02-03 MED ORDER — VITAMIN D (ERGOCALCIFEROL) 1.25 MG (50000 UNIT) PO CAPS: 50000.0000 [IU] | ORAL_CAPSULE | ORAL | 2 refills | Status: AC

## 2020-02-03 NOTE — Addendum Note (Signed)
Addended by: Roney Jaffe on: 02/03/2020 01:36 PM   Modules accepted: Orders

## 2020-02-04 MED ORDER — ROSUVASTATIN CALCIUM 10 MG PO TABS: ORAL_TABLET | ORAL | 1 refills | Status: DC

## 2020-02-05 ENCOUNTER — Telehealth: Payer: Self-pay | Admitting: *Deleted

## 2020-02-05 DIAGNOSIS — M5136 Other intervertebral disc degeneration, lumbar region: Secondary | ICD-10-CM | POA: Diagnosis not present

## 2020-02-05 DIAGNOSIS — M79669 Pain in unspecified lower leg: Secondary | ICD-10-CM | POA: Diagnosis not present

## 2020-02-05 DIAGNOSIS — R5382 Chronic fatigue, unspecified: Secondary | ICD-10-CM | POA: Diagnosis not present

## 2020-02-05 DIAGNOSIS — M25559 Pain in unspecified hip: Secondary | ICD-10-CM | POA: Diagnosis not present

## 2020-02-05 DIAGNOSIS — G894 Chronic pain syndrome: Secondary | ICD-10-CM | POA: Diagnosis not present

## 2020-02-05 DIAGNOSIS — I119 Hypertensive heart disease without heart failure: Secondary | ICD-10-CM | POA: Diagnosis not present

## 2020-02-05 DIAGNOSIS — M792 Neuralgia and neuritis, unspecified: Secondary | ICD-10-CM | POA: Diagnosis not present

## 2020-02-05 DIAGNOSIS — M79609 Pain in unspecified limb: Secondary | ICD-10-CM | POA: Diagnosis not present

## 2020-02-05 DIAGNOSIS — Z79891 Long term (current) use of opiate analgesic: Secondary | ICD-10-CM | POA: Diagnosis not present

## 2020-02-05 DIAGNOSIS — M48062 Spinal stenosis, lumbar region with neurogenic claudication: Secondary | ICD-10-CM | POA: Diagnosis not present

## 2020-02-05 DIAGNOSIS — M4326 Fusion of spine, lumbar region: Secondary | ICD-10-CM | POA: Diagnosis not present

## 2020-02-05 NOTE — Telephone Encounter (Signed)
-----   Message from Roney Jaffe, [place] sent at 02/03/2020  1:36 PM EST ----- Please call patient and let him know that his testosterone levels were within normal limits, continue treatment as prescribed.  His vitamin D levels were low, he needs to take 50,000 units of vitamin D once a week for the next 12 weeks and have it rechecked after that, prescription will be sent to his pharmacy.  Kidney and liver function are within normal limits.  His cholesterol was extremely elevated, please verify with patient that he is taking his cholesterol medication as directed and he was fasting during these labs.

## 2020-02-05 NOTE — Telephone Encounter (Signed)
Patient verified DOB Patient is aware of labs being normal. Patient advised to continue testosterone treatment and begin vitamin d weekly supplements. Patient is aware of cholesterol level being extremely high and admits to not taking medication. Patient has picked up supplement and cholesterol medication today. Patient will FU with PCP regarding refill for buspar 10mg  after checking his auto refill.

## 2020-02-06 ENCOUNTER — Other Ambulatory Visit: Payer: Self-pay | Admitting: Physician Assistant

## 2020-02-06 DIAGNOSIS — E559 Vitamin D deficiency, unspecified: Secondary | ICD-10-CM

## 2020-02-06 DIAGNOSIS — F339 Major depressive disorder, recurrent, unspecified: Secondary | ICD-10-CM

## 2020-02-10 DIAGNOSIS — G894 Chronic pain syndrome: Secondary | ICD-10-CM | POA: Diagnosis not present

## 2020-02-13 ENCOUNTER — Ambulatory Visit (INDEPENDENT_AMBULATORY_CARE_PROVIDER_SITE_OTHER): Payer: Medicare Other | Admitting: Internal Medicine

## 2020-02-13 ENCOUNTER — Other Ambulatory Visit: Payer: Self-pay

## 2020-02-13 ENCOUNTER — Encounter: Payer: Self-pay | Admitting: Internal Medicine

## 2020-02-13 VITALS — BP 134/88 | HR 86 | Temp 97.5°F | Resp 17 | Wt 309.0 lb

## 2020-02-13 DIAGNOSIS — R7989 Other specified abnormal findings of blood chemistry: Secondary | ICD-10-CM

## 2020-02-13 DIAGNOSIS — E559 Vitamin D deficiency, unspecified: Secondary | ICD-10-CM | POA: Diagnosis not present

## 2020-02-13 DIAGNOSIS — Z23 Encounter for immunization: Secondary | ICD-10-CM

## 2020-02-13 DIAGNOSIS — E119 Type 2 diabetes mellitus without complications: Secondary | ICD-10-CM | POA: Diagnosis not present

## 2020-02-13 LAB — POCT GLYCOSYLATED HEMOGLOBIN (HGB A1C): Hemoglobin A1C: 6.8 % — AB (ref 4.0–5.6)

## 2020-02-13 NOTE — Progress Notes (Signed)
Subjective:    Aaron Giles - 59 y.o. male MRN [id number]  Date of birth: [birth year]  HPI  Aaron Giles is here for follow up of T2DM.  Diabetes mellitus, Type 2 Disease Monitoring             Blood Sugar Ranges: Fasting - 110-120             Polyuria: no             Visual problems: no   Urine Microalbumin 13 (Dec 2021)   Last A1C: 8.5 (July 2021)   Medications: Metformin 1000 mg BID  Medication Compliance: yes  Medication Side Effects             Hypoglycemia: no        Health Maintenance:  Health Maintenance Due  Topic Date Due  . FOOT EXAM  Never done  . OPHTHALMOLOGY EXAM  Never done  . COLONOSCOPY  Never done  . INFLUENZA VACCINE  09/29/2019  . COVID-19 Vaccine (3 - Booster for Pfizer series) 02/02/2020    -  reports that he has been smoking. He has been smoking about 0.50 packs per day. He has never used smokeless tobacco. - Review of Systems: Per HPI. - Past Medical History: Patient Active Problem List   Diagnosis Date Noted  . Sleep apnea 03/14/2019  . Screening for malignant neoplasm of prostate 03/14/2019  . Low testosterone 03/14/2019  . Occipital headache 01/28/2019  . Myofascial pain syndrome 01/28/2019  . Post laminectomy syndrome 01/28/2019  . Chronic pain syndrome 01/28/2019  . Venous stasis   . Hypertension   . Diabetes mellitus without complication (HCC)   . Herniated nucleus pulposus, L5-S1 03/28/2018  . Ruptured lumbar disc 03/28/2018  . Lumbar radiculopathy, chronic 03/28/2018  . Long term (current) use of opiate analgesic 07/19/2017  . Hypogonadotropic hypogonadism (HCC) 04/03/2017  . Cervical spondylosis 02/01/2017  . Lumbar spondylosis 10/19/2016  . Diverticulosis of colon 08/11/2016  . Hardening of the aorta (main artery of the heart) (HCC) 08/11/2016  . Hx of colonic polyp 08/11/2016  . Umbilical hernia 08/11/2016  . Obesity, unspecified 07/19/2016  . Type 2 diabetes mellitus with other specified complication (HCC)  07/19/2016  . Vitamin D deficiency 04/27/2016  . Chronic hepatitis C (HCC) 03/04/2016  . Chronic venous hypertension (idiopathic) without complications of left lower extremity 03/04/2016  . COPD (chronic obstructive pulmonary disease) (HCC) 03/04/2016  . Hx of spinal fusion 03/04/2016  . Nicotine dependence 03/04/2016  . Peripheral autonomic neuropathy 03/04/2016  . Personal history of other diseases of the digestive system 03/04/2016   - Medications: reviewed and updated   Objective:   Physical Exam BP 134/88   Pulse 86   Temp (!) 97.5 F (36.4 C) (Temporal)   Resp 17   Wt (!) 309 lb (140.2 kg)   SpO2 94%   BMI 41.91 kg/m  Physical Exam Constitutional:      General: He is not in acute distress.    Appearance: He is not diaphoretic.  Cardiovascular:     Rate and Rhythm: Normal rate.  Pulmonary:     Effort: Pulmonary effort is normal. No respiratory distress.  Musculoskeletal:        General: Normal range of motion.  Skin:    General: Skin is warm and dry.  Neurological:     Mental Status: He is alert and oriented to person, place, and time.  Psychiatric:        Mood and Affect: Affect normal.  Judgment: Judgment normal.            Assessment & Plan:   1. Type 2 diabetes mellitus without complication, without long-term current use of insulin (HCC) A1c at goal with result of 6.8. Continue Metformin.  Counseled on Diabetic diet, my plate method, 530 minutes of moderate intensity exercise/week Blood sugar logs with fasting goals of 80-120 mg/dl, random of less than 051 and in the event of sugars less than 60 mg/dl or greater than 102 mg/dl encouraged to notify the clinic. Advised on the need for annual eye exams, annual foot exams, Pneumonia vaccine. - HgB A1c - HM Diabetes Foot Exam  2. Vitamin D deficiency Vit D level 13. Patient prescribed 12 weeks of 11173 IU supplement. Will need to follow up after completes course.   3. Low  testosterone Testosterone improved from 65 to 354 with injections. Continue. Will need to monitor level, HCT, and PSA at next visit.   4. Needs flu shot - Flu Vaccine QUAD 6+ mos PF IM (Fluarix Quad PF)   Marcy Siren, D.O. 02/13/2020, 2:16 PM Primary Care at [facility]

## 2020-02-17 MED ORDER — BUPROPION HCL ER (SR) 150 MG PO TB12
150.0000 mg | ORAL_TABLET | Freq: Every evening | ORAL | 0 refills | Status: DC
Start: 2020-02-17 — End: 2020-03-20

## 2020-02-25 ENCOUNTER — Other Ambulatory Visit: Payer: Self-pay | Admitting: Family Medicine

## 2020-03-02 DIAGNOSIS — G894 Chronic pain syndrome: Secondary | ICD-10-CM | POA: Diagnosis not present

## 2020-03-02 DIAGNOSIS — Z79891 Long term (current) use of opiate analgesic: Secondary | ICD-10-CM | POA: Diagnosis not present

## 2020-03-03 ENCOUNTER — Other Ambulatory Visit: Payer: Self-pay

## 2020-03-03 ENCOUNTER — Ambulatory Visit (HOSPITAL_BASED_OUTPATIENT_CLINIC_OR_DEPARTMENT_OTHER): Payer: Medicare Other | Attending: Internal Medicine | Admitting: Internal Medicine

## 2020-03-03 ENCOUNTER — Other Ambulatory Visit: Payer: Self-pay | Admitting: Internal Medicine

## 2020-03-03 VITALS — Ht 72.0 in | Wt 319.0 lb

## 2020-03-03 DIAGNOSIS — G4734 Idiopathic sleep related nonobstructive alveolar hypoventilation: Secondary | ICD-10-CM

## 2020-03-03 DIAGNOSIS — G4733 Obstructive sleep apnea (adult) (pediatric): Secondary | ICD-10-CM | POA: Insufficient documentation

## 2020-03-04 ENCOUNTER — Other Ambulatory Visit (HOSPITAL_BASED_OUTPATIENT_CLINIC_OR_DEPARTMENT_OTHER): Payer: Self-pay

## 2020-03-04 DIAGNOSIS — G4733 Obstructive sleep apnea (adult) (pediatric): Secondary | ICD-10-CM

## 2020-03-04 DIAGNOSIS — G4734 Idiopathic sleep related nonobstructive alveolar hypoventilation: Secondary | ICD-10-CM

## 2020-03-05 ENCOUNTER — Encounter (HOSPITAL_BASED_OUTPATIENT_CLINIC_OR_DEPARTMENT_OTHER): Payer: Medicare Other | Admitting: Internal Medicine

## 2020-03-09 DIAGNOSIS — H905 Unspecified sensorineural hearing loss: Secondary | ICD-10-CM | POA: Diagnosis not present

## 2020-03-11 DIAGNOSIS — G894 Chronic pain syndrome: Secondary | ICD-10-CM | POA: Diagnosis not present

## 2020-03-13 ENCOUNTER — Other Ambulatory Visit: Payer: Self-pay | Admitting: Internal Medicine

## 2020-03-13 DIAGNOSIS — F339 Major depressive disorder, recurrent, unspecified: Secondary | ICD-10-CM

## 2020-03-14 DIAGNOSIS — G4733 Obstructive sleep apnea (adult) (pediatric): Secondary | ICD-10-CM

## 2020-03-14 DIAGNOSIS — G4734 Idiopathic sleep related nonobstructive alveolar hypoventilation: Secondary | ICD-10-CM | POA: Diagnosis not present

## 2020-03-14 NOTE — Procedures (Signed)
Patient Name: Aaron Giles, Aaron Giles Date: 03/03/2020 Gender: Male D.O.B: [birth year] Age (years): 59 Referring Provider: De Hollingshead DO Height (inches): 72 Interpreting Physician: Jetty Duhamel MD, ABSM Weight (lbs): 319 RPSGT: Armen Pickup BMI: 43 MRN: [id number] Neck Size: 18.00  CLINICAL INFORMATION The patient is referred for a CPAP titration to treat sleep apnea.  Date of NPSG, Split Night or HST: NPSG 1/25.21  AHI 7/ hr, desaturation to 85%, body weight 309 lbs  SLEEP STUDY TECHNIQUE As per the AASM Manual for the Scoring of Sleep and Associated Events v2.3 (April 2016) with a hypopnea requiring 4% desaturations.  The channels recorded and monitored were frontal, central and occipital EEG, electrooculogram (EOG), submentalis EMG (chin), nasal and oral airflow, thoracic and abdominal wall motion, anterior tibialis EMG, snore microphone, electrocardiogram, and pulse oximetry. Continuous positive airway pressure (CPAP) was initiated at the beginning of the study and titrated to treat sleep-disordered breathing.  MEDICATIONS Medications self-administered by patient taken the night of the study : none reported  TECHNICIAN COMMENTS Comments added by technician: NO RESTROOM VISTED. Patient sleep in a recliner chair. Patient had difficulty initiating sleep. Patient was restless all through the night. Comments added by scorer: N/A  RESPIRATORY PARAMETERS Optimal PAP Pressure (cm): 17 AHI at Optimal Pressure (/hr): 0.0 Overall Minimal O2 (%): 86.0 Supine % at Optimal Pressure (%): 0 Minimal O2 at Optimal Pressure (%): 89.0   SLEEP ARCHITECTURE The study was initiated at 9:57:11 PM and ended at 4:33:06 AM.  Sleep onset time was 117.2 minutes and the sleep efficiency was 52.7%%. The total sleep time was 208.5 minutes.  The patient spent 34.3%% of the night in stage N1 sleep, 52.8%% in stage N2 sleep, 0.0%% in stage N3 and 13% in REM.Stage REM latency was 80.0  minutes  Wake after sleep onset was 70.2. Alpha intrusion was absent. Supine sleep was 0.00%.  CARDIAC DATA The 2 lead EKG demonstrated sinus rhythm. The mean heart rate was 75.0 beats per minute. Other EKG findings include: None.  LEG MOVEMENT DATA The total Periodic Limb Movements of Sleep (PLMS) were 0. The PLMS index was 0.0. A PLMS index of <15 is considered normal in adults.  IMPRESSIONS - The optimal PAP pressure was 17 cm of water. - Central sleep apnea was not noted during this titration (CAI = 0.0/h). - Oxygen desaturations were observed during this titration (min O2 = 86.0%). On CPAP 14, Min Sat 89%, Mean Sat 91.8%. - No snoring was audible during this study. - No cardiac abnormalities were observed during this study. - Clinically significant periodic limb movements were not noted during this study. Arousals associated with PLMs were rare. - Sleep onset delayed- patient finally asleep around 12:00 MN. Sleep was markedly fragmented through the night by frequent brief arousals and awakenings.  - Patient slept in a recliner.  DIAGNOSIS - Obstructive Sleep Apnea (G47.33)  RECOMMENDATIONS - Trial of CPAP therapy on 17 cm H2O or autopap 12-20. - Patient used a Medium size Resmed Full Face AirFit F20 mask and heated humidification. - Consider if additional intervention for insomnia may be helpful. - Sleep hygiene should be reviewed to assess factors that may improve sleep quality. - Weight management and regular exercise should be initiated or continued.  [Electronically signed] 03/14/2020 04:36 PM  Jetty Duhamel MD, ABSM Diplomate, American Board of Sleep Medicine   NPI: [id number]  Jetty Duhamel Diplomate, Biomedical engineer of Sleep Medicine  ELECTRONICALLY SIGNED ON:  03/14/2020, 4:29 PM [facility] PH: [phone]   FX: [phone] ACCREDITED BY THE AMERICAN ACADEMY OF SLEEP MEDICINE

## 2020-03-20 ENCOUNTER — Other Ambulatory Visit: Payer: Self-pay

## 2020-03-20 ENCOUNTER — Other Ambulatory Visit: Payer: Self-pay | Admitting: Internal Medicine

## 2020-03-20 DIAGNOSIS — G4733 Obstructive sleep apnea (adult) (pediatric): Secondary | ICD-10-CM

## 2020-03-20 DIAGNOSIS — G4734 Idiopathic sleep related nonobstructive alveolar hypoventilation: Secondary | ICD-10-CM

## 2020-03-20 DIAGNOSIS — F339 Major depressive disorder, recurrent, unspecified: Secondary | ICD-10-CM

## 2020-03-20 MED ORDER — BUPROPION HCL ER (XL) 300 MG PO TB24: 300.0000 mg | ORAL_TABLET | Freq: Every day | ORAL | 2 refills | Status: DC

## 2020-03-20 MED ORDER — BUPROPION HCL ER (SR) 150 MG PO TB12: 150.0000 mg | ORAL_TABLET | Freq: Every evening | ORAL | 0 refills | Status: AC

## 2020-03-20 NOTE — Progress Notes (Signed)
Pt aware of results/order, will send order to [facility], pt aware they will reach out to him. Pt also requested refill on Wellbutrin, out of 300mg  for a while and has been doubling 150mg  to make 3000mg  so almost out of that as well, refills sent to pharmacy on file.

## 2020-03-30 DIAGNOSIS — G894 Chronic pain syndrome: Secondary | ICD-10-CM | POA: Diagnosis not present

## 2020-04-01 ENCOUNTER — Other Ambulatory Visit: Payer: Self-pay

## 2020-04-01 DIAGNOSIS — I10 Essential (primary) hypertension: Secondary | ICD-10-CM

## 2020-04-01 DIAGNOSIS — F339 Major depressive disorder, recurrent, unspecified: Secondary | ICD-10-CM

## 2020-04-01 MED ORDER — VALSARTAN-HYDROCHLOROTHIAZIDE 160-25 MG PO TABS: 1.0000 | ORAL_TABLET | Freq: Every day | ORAL | 1 refills | Status: DC

## 2020-04-01 NOTE — Telephone Encounter (Signed)
Unsure if pt is still taking this in addition to the Wellbutrin (300mg  in AM, 150mg  HS), pls refill if appropriate, thanks

## 2020-04-02 MED ORDER — SERTRALINE HCL 100 MG PO TABS: 100.0000 mg | ORAL_TABLET | Freq: Every day | ORAL | 1 refills | Status: DC

## 2020-04-10 DIAGNOSIS — M5030 Other cervical disc degeneration, unspecified cervical region: Secondary | ICD-10-CM | POA: Diagnosis not present

## 2020-04-10 DIAGNOSIS — G894 Chronic pain syndrome: Secondary | ICD-10-CM | POA: Diagnosis not present

## 2020-04-10 DIAGNOSIS — M5136 Other intervertebral disc degeneration, lumbar region: Secondary | ICD-10-CM | POA: Diagnosis not present

## 2020-04-10 DIAGNOSIS — Z79891 Long term (current) use of opiate analgesic: Secondary | ICD-10-CM | POA: Diagnosis not present

## 2020-04-18 ENCOUNTER — Other Ambulatory Visit: Payer: Self-pay | Admitting: Family Medicine

## 2020-04-20 DIAGNOSIS — M5412 Radiculopathy, cervical region: Secondary | ICD-10-CM | POA: Diagnosis not present

## 2020-04-20 DIAGNOSIS — M47816 Spondylosis without myelopathy or radiculopathy, lumbar region: Secondary | ICD-10-CM | POA: Diagnosis not present

## 2020-04-27 DIAGNOSIS — G894 Chronic pain syndrome: Secondary | ICD-10-CM | POA: Diagnosis not present

## 2020-04-30 DIAGNOSIS — M5021 Other cervical disc displacement,  high cervical region: Secondary | ICD-10-CM | POA: Diagnosis not present

## 2020-04-30 DIAGNOSIS — M50221 Other cervical disc displacement at C4-C5 level: Secondary | ICD-10-CM | POA: Diagnosis not present

## 2020-04-30 DIAGNOSIS — M5412 Radiculopathy, cervical region: Secondary | ICD-10-CM | POA: Diagnosis not present

## 2020-04-30 DIAGNOSIS — R202 Paresthesia of skin: Secondary | ICD-10-CM | POA: Diagnosis not present

## 2020-04-30 DIAGNOSIS — M9961 Osseous and subluxation stenosis of intervertebral foramina of cervical region: Secondary | ICD-10-CM | POA: Diagnosis not present

## 2020-04-30 DIAGNOSIS — M4802 Spinal stenosis, cervical region: Secondary | ICD-10-CM | POA: Diagnosis not present

## 2020-05-10 DIAGNOSIS — G894 Chronic pain syndrome: Secondary | ICD-10-CM | POA: Diagnosis not present

## 2020-05-19 ENCOUNTER — Other Ambulatory Visit: Payer: Self-pay | Admitting: Internal Medicine

## 2020-05-19 DIAGNOSIS — F339 Major depressive disorder, recurrent, unspecified: Secondary | ICD-10-CM

## 2020-05-25 DIAGNOSIS — G894 Chronic pain syndrome: Secondary | ICD-10-CM | POA: Diagnosis not present

## 2020-05-25 DIAGNOSIS — E1140 Type 2 diabetes mellitus with diabetic neuropathy, unspecified: Secondary | ICD-10-CM | POA: Diagnosis not present

## 2020-05-25 DIAGNOSIS — R519 Headache, unspecified: Secondary | ICD-10-CM | POA: Diagnosis not present

## 2020-05-25 DIAGNOSIS — M79669 Pain in unspecified lower leg: Secondary | ICD-10-CM | POA: Diagnosis not present

## 2020-05-25 DIAGNOSIS — R5382 Chronic fatigue, unspecified: Secondary | ICD-10-CM | POA: Diagnosis not present

## 2020-05-25 DIAGNOSIS — M9951 Intervertebral disc stenosis of neural canal of cervical region: Secondary | ICD-10-CM | POA: Diagnosis not present

## 2020-05-25 DIAGNOSIS — M48062 Spinal stenosis, lumbar region with neurogenic claudication: Secondary | ICD-10-CM | POA: Diagnosis not present

## 2020-05-25 DIAGNOSIS — M9931 Osseous stenosis of neural canal of cervical region: Secondary | ICD-10-CM | POA: Diagnosis not present

## 2020-05-25 DIAGNOSIS — M25559 Pain in unspecified hip: Secondary | ICD-10-CM | POA: Diagnosis not present

## 2020-05-25 DIAGNOSIS — M47897 Other spondylosis, lumbosacral region: Secondary | ICD-10-CM | POA: Diagnosis not present

## 2020-05-25 DIAGNOSIS — G8929 Other chronic pain: Secondary | ICD-10-CM | POA: Diagnosis not present

## 2020-05-25 NOTE — Telephone Encounter (Signed)
Please place order for additional refills if appropriate. Patient is worried about having a lapse time between the next refill

## 2020-06-01 ENCOUNTER — Ambulatory Visit (INDEPENDENT_AMBULATORY_CARE_PROVIDER_SITE_OTHER): Payer: Medicare Other | Admitting: Internal Medicine

## 2020-06-01 ENCOUNTER — Encounter: Payer: Self-pay | Admitting: Internal Medicine

## 2020-06-01 ENCOUNTER — Other Ambulatory Visit: Payer: Self-pay

## 2020-06-01 VITALS — BP 130/87 | HR 71 | Temp 97.3°F | Wt 328.0 lb

## 2020-06-01 DIAGNOSIS — E785 Hyperlipidemia, unspecified: Secondary | ICD-10-CM | POA: Diagnosis not present

## 2020-06-01 DIAGNOSIS — E119 Type 2 diabetes mellitus without complications: Secondary | ICD-10-CM

## 2020-06-01 DIAGNOSIS — E559 Vitamin D deficiency, unspecified: Secondary | ICD-10-CM

## 2020-06-01 DIAGNOSIS — R413 Other amnesia: Secondary | ICD-10-CM

## 2020-06-01 LAB — GLUCOSE, POCT (MANUAL RESULT ENTRY): POC Glucose: 144 mg/dl — AB (ref 70–99)

## 2020-06-01 LAB — POCT GLYCOSYLATED HEMOGLOBIN (HGB A1C): HbA1c, POC (controlled diabetic range): 8 % — AB (ref 0.0–7.0)

## 2020-06-01 MED ORDER — "NEEDLE (DISP) 22G X 1"" MISC": 1.0000 | 3 refills | Status: AC

## 2020-06-01 MED ORDER — "NEEDLE (DISP) 18G X 1"" MISC": 1.0000 | 3 refills | Status: AC

## 2020-06-01 NOTE — Progress Notes (Signed)
F/U DM  Refill on

## 2020-06-01 NOTE — Progress Notes (Signed)
Subjective:    Aaron Giles - 60 y.o. male MRN [id number]  Date of birth: 1960/09/13  HPI  Aaron Giles is here for follow up of T2DM.  Diabetes mellitus, Type 2 Disease Monitoring             Blood Sugar Ranges:Not monitoring              Polyuria: no              Visual problems: no   Urine Microalbumin 13 (Dec 2021) --on Arb   Last A1C: 6.8 (Dec 2021)   Medications: Metformin 1000 mg BID  Medication Compliance: yes  Medication Side Effects             Hypoglycemia: no    The 10-year ASCVD risk score Aaron George DC Jr., et al., 2013) is: 42.3%   Values used to calculate the score:     Age: 47 years     Sex: Male     Is Non-Hispanic African American: No     Diabetic: Yes     Tobacco smoker: Yes     Systolic Blood Pressure: 130 mmHg     Is BP treated: Yes     HDL Cholesterol: 33 mg/dL     Total Cholesterol: 255 mg/dL      Health Maintenance:  Health Maintenance Due  Topic Date Due  . OPHTHALMOLOGY EXAM  Never done  . COLONOSCOPY (Pts 45-68yrs Insurance coverage will need to be confirmed)  Never done  . COVID-19 Vaccine (3 - Booster for Pfizer series) 02/02/2020    -  reports that he has been smoking. He has been smoking about 0.50 packs per day. He has never used smokeless tobacco. - Review of Systems: Per HPI. - Past Medical History: Patient Active Problem List   Diagnosis Date Noted  . Chronic intermittent hypoxia with obstructive sleep apnea 03/14/2019  . Screening for malignant neoplasm of prostate 03/14/2019  . Low testosterone 03/14/2019  . Occipital headache 01/28/2019  . Myofascial pain syndrome 01/28/2019  . Post laminectomy syndrome 01/28/2019  . Chronic pain syndrome 01/28/2019  . Venous stasis   . Hypertension   . Diabetes mellitus without complication (HCC)   . Herniated nucleus pulposus, L5-S1 03/28/2018  . Ruptured lumbar disc 03/28/2018  . Lumbar radiculopathy, chronic 03/28/2018  . Long term (current) use of opiate analgesic  07/19/2017  . Hypogonadotropic hypogonadism (HCC) 04/03/2017  . Cervical spondylosis 02/01/2017  . Lumbar spondylosis 10/19/2016  . Diverticulosis of colon 08/11/2016  . Hardening of the aorta (main artery of the heart) (HCC) 08/11/2016  . Hx of colonic polyp 08/11/2016  . Umbilical hernia 08/11/2016  . Obesity, unspecified 07/19/2016  . Type 2 diabetes mellitus with other specified complication (HCC) 07/19/2016  . Vitamin D deficiency 04/27/2016  . Chronic hepatitis C (HCC) 03/04/2016  . Chronic venous hypertension (idiopathic) without complications of left lower extremity 03/04/2016  . COPD (chronic obstructive pulmonary disease) (HCC) 03/04/2016  . Hx of spinal fusion 03/04/2016  . Nicotine dependence 03/04/2016  . Peripheral autonomic neuropathy 03/04/2016  . Personal history of other diseases of the digestive system 03/04/2016   - Medications: reviewed and updated   Objective:   Physical Exam BP 130/87   Pulse 71   Temp (!) 97.3 F (36.3 C)   Wt (!) 328 lb (148.8 kg)   SpO2 93%   BMI 44.48 kg/m  Physical Exam Constitutional:      General: He is not in acute distress.  Appearance: He is not diaphoretic.  HENT:     Head: Normocephalic and atraumatic.  Eyes:     Conjunctiva/sclera: Conjunctivae normal.  Cardiovascular:     Rate and Rhythm: Normal rate and regular rhythm.     Heart sounds: Normal heart sounds. No murmur heard.   Pulmonary:     Effort: Pulmonary effort is normal. No respiratory distress.     Breath sounds: Normal breath sounds.  Musculoskeletal:        General: Normal range of motion.  Skin:    General: Skin is warm and dry.  Neurological:     Mental Status: He is alert and oriented to person, place, and time.  Psychiatric:        Mood and Affect: Affect normal.        Judgment: Judgment normal.            Assessment & Plan:   1. Type 2 diabetes mellitus without complication, without long-term current use of insulin (HCC) A1c  worsened from 6.8 to 8. Discussed diet compliance and exercise goals. Will not change regimen at present and focus on lifestyle changes.  Counseled on Diabetic diet, my plate method, 932 minutes of moderate intensity exercise/week Blood sugar logs with fasting goals of 80-120 mg/dl, random of less than 355 and in the event of sugars less than 60 mg/dl or greater than 732 mg/dl encouraged to notify the clinic. Advised on the need for annual eye exams, annual foot exams, Pneumonia vaccine. - Glucose (CBG) - HgB A1c  2. Vitamin D deficiency Has completed Vit D supplementation. Monitor labs to guide further therapy.  - VITAMIN D 25 Hydroxy (Vit-D Deficiency, Fractures)  3. Hyperlipidemia, unspecified hyperlipidemia type On Crestor 10 mg. Discussed that prior results were very uncontrolled and ASCVD risk was high. Will repeat and may benefit from higher intensity statin.  - Lipid panel  4. Memory loss Patient reported memory loss at the end of the visit without significant time to investigate further. Will obtain basic screening labs to evaluate for potential causes. If unremarkable, would consider MMSE and neurology referral. Patient does have significant chronic depression that has been difficult to control and this could certainly be impacting memory.  - Comprehensive metabolic panel - TSH - Vitamin B12 - Folate      Aaron Giles, D.O. 06/01/2020, 3:33 PM Primary Care at [facility]

## 2020-06-02 ENCOUNTER — Telehealth: Payer: Self-pay | Admitting: Internal Medicine

## 2020-06-02 NOTE — Telephone Encounter (Signed)
PRO AIR INHALER. Pt called requesting inhaler sent to [facility] [address]  Pt states usually requires prior auth.  Pt confirmed phone [phone].

## 2020-06-03 NOTE — Telephone Encounter (Signed)
We can try Albuterol or Ventolin to see if those do not require PA. Should be able to look up formulary with his insurance.   Marcy Siren, D.O. Primary Care at [facility]  06/03/2020, 11:20 AM

## 2020-06-04 ENCOUNTER — Other Ambulatory Visit: Payer: Self-pay | Admitting: Internal Medicine

## 2020-06-04 LAB — COMPREHENSIVE METABOLIC PANEL
ALT: 59 IU/L — ABNORMAL HIGH (ref 0–44)
AST: 56 IU/L — ABNORMAL HIGH (ref 0–40)
Albumin/Globulin Ratio: 1.9 (ref 1.2–2.2)
Albumin: 4.8 g/dL (ref 3.8–4.9)
Alkaline Phosphatase: 65 IU/L (ref 44–121)
BUN/Creatinine Ratio: 11 (ref 9–20)
BUN: 10 mg/dL (ref 6–24)
Bilirubin Total: 0.3 mg/dL (ref 0.0–1.2)
CO2: 25 mmol/L (ref 20–29)
Calcium: 9 mg/dL (ref 8.7–10.2)
Chloride: 97 mmol/L (ref 96–106)
Creatinine, Ser: 0.94 mg/dL (ref 0.76–1.27)
Globulin, Total: 2.5 g/dL (ref 1.5–4.5)
Glucose: 126 mg/dL — ABNORMAL HIGH (ref 65–99)
Potassium: 4.3 mmol/L (ref 3.5–5.2)
Sodium: 139 mmol/L (ref 134–144)
Total Protein: 7.3 g/dL (ref 6.0–8.5)
eGFR: 94 mL/min/{1.73_m2} (ref >59)

## 2020-06-04 LAB — LIPID PANEL
Chol/HDL Ratio: 3.7 ratio (ref 0.0–5.0)
Cholesterol, Total: 145 mg/dL (ref 100–199)
HDL: 39 mg/dL — ABNORMAL LOW (ref >39)
LDL Chol Calc (NIH): 69 mg/dL (ref 0–99)
Triglycerides: 225 mg/dL — ABNORMAL HIGH (ref 0–149)
VLDL Cholesterol Cal: 37 mg/dL (ref 5–40)

## 2020-06-04 LAB — SPECIMEN STATUS REPORT

## 2020-06-04 LAB — VITAMIN D 25 HYDROXY (VIT D DEFICIENCY, FRACTURES): Vit D, 25-Hydroxy: 26.6 ng/mL — ABNORMAL LOW (ref 30.0–100.0)

## 2020-06-04 LAB — FOLATE: Folate: 13 ng/mL (ref >3.0)

## 2020-06-04 LAB — VITAMIN B12: Vitamin B-12: 671 pg/mL (ref 232–1245)

## 2020-06-04 LAB — TSH: TSH: 2.42 u[IU]/mL (ref 0.450–4.500)

## 2020-06-04 MED ORDER — ROSUVASTATIN CALCIUM 20 MG PO TABS: 20.0000 mg | ORAL_TABLET | Freq: Every day | ORAL | 3 refills | Status: DC

## 2020-06-04 NOTE — Telephone Encounter (Signed)
Pt called checking status of Pro Air Inhaler prescription being sent to pharmacy.  Asked pt if his insurance offered an alternative inhaler. Pt states he has not had the inhaler in a long time but remembers last time it required PA.

## 2020-06-05 MED ORDER — ALBUTEROL SULFATE HFA 108 (90 BASE) MCG/ACT IN AERS: 2.0000 | INHALATION_SPRAY | Freq: Four times a day (QID) | RESPIRATORY_TRACT | 1 refills | Status: DC | PRN

## 2020-06-05 NOTE — Addendum Note (Signed)
Addended by: Rema Fendt on: 06/05/2020 12:53 PM   Modules accepted: Orders

## 2020-06-05 NOTE — Telephone Encounter (Signed)
Albuterol inhaler prescribed per request.

## 2020-06-09 DIAGNOSIS — G894 Chronic pain syndrome: Secondary | ICD-10-CM | POA: Diagnosis not present

## 2020-06-15 ENCOUNTER — Telehealth: Payer: Self-pay | Admitting: Internal Medicine

## 2020-06-15 ENCOUNTER — Encounter: Payer: Self-pay | Admitting: *Deleted

## 2020-06-15 NOTE — Telephone Encounter (Signed)
Pt called trying to returning call. I informed pt that I did not see any note in his chart about clinical team calling. Please follow up and advise pt with next steps. His cell is [phone].

## 2020-06-16 DIAGNOSIS — M47816 Spondylosis without myelopathy or radiculopathy, lumbar region: Secondary | ICD-10-CM | POA: Diagnosis not present

## 2020-06-17 NOTE — Telephone Encounter (Signed)
Pt name and DOB verified. Patient aware of results and result note Dr. Earlene Plater.

## 2020-06-22 DIAGNOSIS — G894 Chronic pain syndrome: Secondary | ICD-10-CM | POA: Diagnosis not present

## 2020-07-09 ENCOUNTER — Other Ambulatory Visit: Payer: Self-pay | Admitting: Internal Medicine

## 2020-07-09 DIAGNOSIS — E119 Type 2 diabetes mellitus without complications: Secondary | ICD-10-CM

## 2020-07-09 DIAGNOSIS — Z79891 Long term (current) use of opiate analgesic: Secondary | ICD-10-CM | POA: Diagnosis not present

## 2020-07-16 ENCOUNTER — Other Ambulatory Visit: Payer: Self-pay

## 2020-07-16 DIAGNOSIS — E119 Type 2 diabetes mellitus without complications: Secondary | ICD-10-CM

## 2020-07-16 MED ORDER — METFORMIN HCL 1000 MG PO TABS: ORAL_TABLET | ORAL | 1 refills | Status: DC

## 2020-07-20 ENCOUNTER — Other Ambulatory Visit: Payer: Self-pay | Admitting: Internal Medicine

## 2020-07-20 ENCOUNTER — Other Ambulatory Visit: Payer: Self-pay | Admitting: Family

## 2020-07-20 DIAGNOSIS — E785 Hyperlipidemia, unspecified: Secondary | ICD-10-CM

## 2020-07-21 DIAGNOSIS — G894 Chronic pain syndrome: Secondary | ICD-10-CM | POA: Diagnosis not present

## 2020-08-05 ENCOUNTER — Other Ambulatory Visit: Payer: Self-pay

## 2020-08-05 ENCOUNTER — Ambulatory Visit
Admission: RE | Admit: 2020-08-05 | Discharge: 2020-08-05 | Disposition: A | Discharge status: Home / Self Care | Payer: Medicare Other | Source: Ambulatory Visit | Attending: Student | Admitting: Student

## 2020-08-05 VITALS — BP 112/63 | HR 105 | Temp 98.6°F | Resp 16

## 2020-08-05 DIAGNOSIS — K120 Recurrent oral aphthae: Secondary | ICD-10-CM

## 2020-08-05 MED ORDER — LIDOCAINE VISCOUS HCL 2 % MT SOLN: 5.0000 mL | Freq: Four times a day (QID) | OROMUCOSAL | 0 refills | Status: AC | PRN

## 2020-08-05 NOTE — ED Provider Notes (Signed)
[facility]    CSN: [id number] Arrival date & time: 08/05/20  0850      History   Chief Complaint Chief Complaint  Patient presents with  . Mouth Lesions    HPI Aaron Giles is a 60 y.o. male presenting with a canker sore due to rough tooth rubbing against tongue.   Medical history chronic hepatitis C, chronic pain syndrome, COPD, type 2 diabetes, neuromuscular disorder, autonomic neuropathy, venous stasis. States lesion started on R side of tongue x3 days, went away x1 month, but is back for 1 week again. Painful to eat. OTC mouthwash helps somewhat.  He does have a rough right lower molar that has been rubbing his tongue in that area, states he filed this down with a nail file and this helped somewhat.  Denies painful swallowing, shortness of breath, dizziness, chest pain.   HPI  Past Medical History:  Diagnosis Date  . ADHD   . Anxiety   . Arthritis   . Carpal tunnel syndrome on right   . Cellulitis   . Cervical spondylosis   . Chronic hepatitis C (HCC)   . Chronic pain syndrome   . COPD (chronic obstructive pulmonary disease) (HCC)   . Depression   . Dyslipidemia associated with type 2 diabetes mellitus (HCC)   . GERD (gastroesophageal reflux disease)   . History of colonic polyps   . History of umbilical hernia   . Hypertension   . Hypogonadotropic hypogonadism (HCC)   . Lumbar spondylosis   . Neuromuscular disorder (HCC)   . OSA (obstructive sleep apnea)   . Peripheral autonomic neuropathy   . Peripheral edema   . Type 2 diabetes mellitus with diabetic neuropathy (HCC)   . Venous stasis     Patient Active Problem List   Diagnosis Date Noted  . Chronic intermittent hypoxia with obstructive sleep apnea 03/14/2019  . Screening for malignant neoplasm of prostate 03/14/2019  . Low testosterone 03/14/2019  . Occipital headache 01/28/2019  . Myofascial pain syndrome 01/28/2019  . Post laminectomy syndrome 01/28/2019  . Chronic pain syndrome  01/28/2019  . Venous stasis   . Hypertension   . Diabetes mellitus without complication (HCC)   . Herniated nucleus pulposus, L5-S1 03/28/2018  . Ruptured lumbar disc 03/28/2018  . Lumbar radiculopathy, chronic 03/28/2018  . Long term (current) use of opiate analgesic 07/19/2017  . Hypogonadotropic hypogonadism (HCC) 04/03/2017  . Cervical spondylosis 02/01/2017  . Lumbar spondylosis 10/19/2016  . Diverticulosis of colon 08/11/2016  . Hardening of the aorta (main artery of the heart) (HCC) 08/11/2016  . Hx of colonic polyp 08/11/2016  . Umbilical hernia 08/11/2016  . Obesity, unspecified 07/19/2016  . Type 2 diabetes mellitus with other specified complication (HCC) 07/19/2016  . Vitamin D deficiency 04/27/2016  . Chronic hepatitis C (HCC) 03/04/2016  . Chronic venous hypertension (idiopathic) without complications of left lower extremity 03/04/2016  . COPD (chronic obstructive pulmonary disease) (HCC) 03/04/2016  . Hx of spinal fusion 03/04/2016  . Nicotine dependence 03/04/2016  . Peripheral autonomic neuropathy 03/04/2016  . Personal history of other diseases of the digestive system 03/04/2016    Past Surgical History:  Procedure Laterality Date  . APPENDECTOMY    . groin abscess Right 2017  . POSTERIOR LUMBAR FUSION     L3-4 fusion  . removal of cyst from testicle Right   . SMALL INTESTINE SURGERY    . UMBILICAL HERNIA REPAIR         Home Medications  Prior to Admission medications   Medication Sig Start Date End Date Taking? Authorizing Provider  magic mouthwash (lidocaine, diphenhydrAMINE, alum & mag hydroxide) suspension Swish and spit 5 mLs 4 (four) times daily as needed for up to 7 days for mouth pain. 08/05/20 08/12/20 Yes Rhys Martini, PA-C  albuterol (VENTOLIN HFA) 108 (90 Base) MCG/ACT inhaler INHALE 2 PUFFS INTO THE LUNGS EVERY 6 HOURS AS NEEDED FOR WHEEZING OR SHORTNESS OF BREATH 07/21/20   Arvilla Market, DO  B-D 3CC LUER-LOK SYR 18GX1-1/2 18G  X 1-1/2" 3 ML MISC USE TO ADMINISTER TESTOSTERONE EVERY 14 DAYS. 09/18/19   [provider]  buPROPion (WELLBUTRIN SR) 150 MG 12 hr tablet Take 1 tablet (150 mg total) by mouth at bedtime. 03/20/20   Arvilla Market, DO  buPROPion (WELLBUTRIN XL) 300 MG 24 hr tablet TAKE 1 TABLET(300 MG) BY MOUTH DAILY 05/28/20   Arvilla Market, DO  busPIRone (BUSPAR) 10 MG tablet TAKE 2 TABLETS(20 MG) BY MOUTH TWICE DAILY 03/16/20   Arvilla Market, DO  HYDROcodone-acetaminophen (NORCO) 10-325 MG tablet Take 1 tablet by mouth 3 (three) times daily as needed. 11/13/19   [provider]  meloxicam (MOBIC) 15 MG tablet TAKE 1/2 TO 1 TABLET(7.5 TO 15 MG) BY MOUTH DAILY AS NEEDED FOR PAIN 02/25/20   Hilts, Casimiro Needle, MD  metFORMIN (GLUCOPHAGE) 1000 MG tablet TAKE 1 TABLET(1000 MG) BY MOUTH TWICE DAILY WITH A MEAL 07/16/20   Arvilla Market, DO  nabumetone (RELAFEN) 750 MG tablet TAKE 1 TABLET(750 MG) BY MOUTH TWICE DAILY AS NEEDED 04/18/20   Hilts, Casimiro Needle, MD  NEEDLE, DISP, 22 G 22G X 1" MISC 1 Stick by Does not apply route every 14 (fourteen) days. 06/01/20   Arvilla Market, DO  NEEDLE, DISP, 30 G 30G X 1" MISC 1 each by Does not apply route every 14 (fourteen) days. 01/09/20   Mayers, Cari S, PA-C  pregabalin (LYRICA) 150 MG capsule Take 1 capsule (150 mg total) by mouth 2 (two) times daily. 03/20/19   Tyrell Antonio, MD  rosuvastatin (CRESTOR) 20 MG tablet Take 1 tablet (20 mg total) by mouth daily. 06/04/20   Arvilla Market, DO  sertraline (ZOLOFT) 100 MG tablet Take 1 tablet (100 mg total) by mouth daily. 04/02/20   Arvilla Market, DO  testosterone cypionate (DEPOTESTOSTERONE CYPIONATE) 200 MG/ML injection Inject 1 mL (200 mg total) into the muscle every 14 (fourteen) days. 01/10/20   Arvilla Market, DO  valsartan-hydrochlorothiazide (DIOVAN-HCT) 160-25 MG tablet Take 1 tablet by mouth daily. 04/01/20   Arvilla Market,  DO  Vitamin D, Ergocalciferol, (DRISDOL) 1.25 MG (50000 UNIT) CAPS capsule Take 1 capsule (50,000 Units total) by mouth every 7 (seven) days. 02/03/20   Mayers, Kasandra Knudsen, PA-C    Family History Family History  Adopted: Yes    Social History Social History   Tobacco Use  . Smoking status: Current Every Day Smoker    Packs/day: 0.50  . Smokeless tobacco: Never Used  Vaping Use  . Vaping Use: Never used  Substance Use Topics  . Alcohol use: Not Currently  . Drug use: Never     Allergies   Patient has no known allergies.   Review of Systems Review of Systems  Skin:       Mouth lesion  All other systems reviewed and are negative.    Physical Exam Triage Vital Signs ED Triage Vitals [08/05/20 0858]  Enc Vitals Group     BP  Pulse      Resp      Temp      Temp src      SpO2      Weight      Height      Head Circumference      Peak Flow      Pain Score 8     Pain Loc      Pain Edu?      Excl. in GC?    No data found.  Updated Vital Signs BP 112/63 (BP Location: Right Arm)   Pulse (!) 105   Temp 98.6 F (37 C) (Oral)   Resp 16   SpO2 94%   Visual Acuity Right Eye Distance:   Left Eye Distance:   Bilateral Distance:    Right Eye Near:   Left Eye Near:    Bilateral Near:     Physical Exam Vitals reviewed.  Constitutional:      General: He is not in acute distress.    Appearance: Normal appearance. He is not ill-appearing or diaphoretic.  HENT:     Head: Normocephalic and atraumatic.     Mouth/Throat:     Dentition: Abnormal dentition.     Comments: Poor dentition.  Multiple missing teeth.  1 right lower molar in place, this has jagged edges medially.  Corresponding area of right tongue with aphthous ulcer.  Measures 0.5 x 0.3 cm.  No surrounding erythema, swelling, discharge, warmth.  No abscess. Cardiovascular:     Rate and Rhythm: Normal rate and regular rhythm.     Heart sounds: Normal heart sounds.  Pulmonary:     Effort: Pulmonary  effort is normal.     Breath sounds: Normal breath sounds.  Skin:    General: Skin is warm.  Neurological:     General: No focal deficit present.     Mental Status: He is alert and oriented to person, place, and time.  Psychiatric:        Mood and Affect: Mood normal.        Behavior: Behavior normal.        Thought Content: Thought content normal.        Judgment: Judgment normal.      UC Treatments / Results  Labs (all labs ordered are listed, but only abnormal results are displayed) Labs Reviewed - No data to display  EKG   Radiology No results found.  Procedures Procedures (including critical care time)  Medications Ordered in UC Medications - No data to display  Initial Impression / Assessment and Plan / UC Course  I have reviewed the triage vital signs and the nursing notes.  Pertinent labs & imaging results that were available during my care of the patient were reviewed by me and considered in my medical decision making (see chart for details).     This patient is a 60 year old male presenting with aphthous ulcer right tongue, likely related to jagged tooth rubbing this area.  Afebrile, nontachycardic.  Plan to treat with Magic mouthwash as below, recommended close dental follow-up.  ED return precautions discussed.  Final Clinical Impressions(s) / UC Diagnoses   Final diagnoses:  Aphthous ulcer     Discharge Instructions     -You have a canker sore.  Use the Magic mouthwash to help alleviate and treat this. -If symptoms do not improve in about 1 week, follow-up with your primary care or dentist.  I attached the dental resource guide in this paperwork. -Seek additional medical  attention if you develop new symptoms like trouble swallowing, shortness of breath, dizziness, chest pain, weakness    ED Prescriptions    Medication Sig Dispense Auth. Provider   magic mouthwash (lidocaine, diphenhydrAMINE, alum & mag hydroxide) suspension Swish and spit 5 mLs  4 (four) times daily as needed for up to 7 days for mouth pain. 200 mL Rhys Martini, PA-C     PDMP not reviewed this encounter.   Rhys Martini, PA-C 08/05/20 [phone]

## 2020-08-05 NOTE — Discharge Instructions (Addendum)
-  You have a canker sore.  Use the Magic mouthwash to help alleviate and treat this. -If symptoms do not improve in about 1 week, follow-up with your primary care or dentist.  I attached the dental resource guide in this paperwork. -Seek additional medical attention if you develop new symptoms like trouble swallowing, shortness of breath, dizziness, chest pain, weakness

## 2020-08-05 NOTE — ED Triage Notes (Signed)
1 month ago came up on right side of tongue, went away and then 1 week ago came back and with more pain and can't eat.

## 2020-08-08 DIAGNOSIS — G894 Chronic pain syndrome: Secondary | ICD-10-CM | POA: Diagnosis not present

## 2020-08-18 DIAGNOSIS — M179 Osteoarthritis of knee, unspecified: Secondary | ICD-10-CM | POA: Diagnosis not present

## 2020-08-18 DIAGNOSIS — M5136 Other intervertebral disc degeneration, lumbar region: Secondary | ICD-10-CM | POA: Diagnosis not present

## 2020-08-18 DIAGNOSIS — G894 Chronic pain syndrome: Secondary | ICD-10-CM | POA: Diagnosis not present

## 2020-08-18 DIAGNOSIS — M1990 Unspecified osteoarthritis, unspecified site: Secondary | ICD-10-CM | POA: Diagnosis not present

## 2020-09-07 DIAGNOSIS — M545 Low back pain, unspecified: Secondary | ICD-10-CM | POA: Diagnosis not present

## 2020-09-07 DIAGNOSIS — G894 Chronic pain syndrome: Secondary | ICD-10-CM | POA: Diagnosis not present

## 2020-09-15 DIAGNOSIS — R519 Headache, unspecified: Secondary | ICD-10-CM | POA: Diagnosis not present

## 2020-09-15 DIAGNOSIS — E1140 Type 2 diabetes mellitus with diabetic neuropathy, unspecified: Secondary | ICD-10-CM | POA: Diagnosis not present

## 2020-09-15 DIAGNOSIS — M48062 Spinal stenosis, lumbar region with neurogenic claudication: Secondary | ICD-10-CM | POA: Diagnosis not present

## 2020-09-15 DIAGNOSIS — M542 Cervicalgia: Secondary | ICD-10-CM | POA: Diagnosis not present

## 2020-09-15 DIAGNOSIS — M47897 Other spondylosis, lumbosacral region: Secondary | ICD-10-CM | POA: Diagnosis not present

## 2020-09-15 DIAGNOSIS — M5030 Other cervical disc degeneration, unspecified cervical region: Secondary | ICD-10-CM | POA: Diagnosis not present

## 2020-09-15 DIAGNOSIS — M79669 Pain in unspecified lower leg: Secondary | ICD-10-CM | POA: Diagnosis not present

## 2020-09-15 DIAGNOSIS — M179 Osteoarthritis of knee, unspecified: Secondary | ICD-10-CM | POA: Diagnosis not present

## 2020-09-15 DIAGNOSIS — G894 Chronic pain syndrome: Secondary | ICD-10-CM | POA: Diagnosis not present

## 2020-09-15 DIAGNOSIS — M25559 Pain in unspecified hip: Secondary | ICD-10-CM | POA: Diagnosis not present

## 2020-09-15 DIAGNOSIS — M5136 Other intervertebral disc degeneration, lumbar region: Secondary | ICD-10-CM | POA: Diagnosis not present

## 2020-09-15 DIAGNOSIS — M4807 Spinal stenosis, lumbosacral region: Secondary | ICD-10-CM | POA: Diagnosis not present

## 2020-09-15 DIAGNOSIS — G8929 Other chronic pain: Secondary | ICD-10-CM | POA: Diagnosis not present

## 2020-09-15 DIAGNOSIS — R5382 Chronic fatigue, unspecified: Secondary | ICD-10-CM | POA: Diagnosis not present

## 2020-09-15 DIAGNOSIS — M792 Neuralgia and neuritis, unspecified: Secondary | ICD-10-CM | POA: Diagnosis not present

## 2020-09-15 DIAGNOSIS — M79609 Pain in unspecified limb: Secondary | ICD-10-CM | POA: Diagnosis not present

## 2020-09-15 DIAGNOSIS — M4726 Other spondylosis with radiculopathy, lumbar region: Secondary | ICD-10-CM | POA: Diagnosis not present

## 2020-09-17 ENCOUNTER — Other Ambulatory Visit: Payer: Self-pay

## 2020-09-17 DIAGNOSIS — F339 Major depressive disorder, recurrent, unspecified: Secondary | ICD-10-CM

## 2020-09-17 MED ORDER — BUSPIRONE HCL 10 MG PO TABS: ORAL_TABLET | ORAL | 0 refills | Status: DC

## 2020-09-24 ENCOUNTER — Other Ambulatory Visit: Payer: Self-pay

## 2020-09-24 ENCOUNTER — Telehealth (INDEPENDENT_AMBULATORY_CARE_PROVIDER_SITE_OTHER): Payer: Self-pay

## 2020-09-24 DIAGNOSIS — F339 Major depressive disorder, recurrent, unspecified: Secondary | ICD-10-CM

## 2020-09-24 DIAGNOSIS — R7989 Other specified abnormal findings of blood chemistry: Secondary | ICD-10-CM

## 2020-09-24 MED ORDER — BUPROPION HCL ER (XL) 300 MG PO TB24: ORAL_TABLET | ORAL | 0 refills | Status: DC

## 2020-09-24 MED ORDER — TESTOSTERONE CYPIONATE 200 MG/ML IM SOLN: 200.0000 mg | INTRAMUSCULAR | 1 refills | Status: DC

## 2020-09-24 MED ORDER — SERTRALINE HCL 100 MG PO TABS: 100.0000 mg | ORAL_TABLET | Freq: Every day | ORAL | 0 refills | Status: AC

## 2020-09-24 NOTE — Telephone Encounter (Signed)
Patient called to make a medication refill for   testosterone cypionate (DEPOTESTOSTERONE CYPIONATE) 200 MG/ML injection  sertraline (ZOLOFT) 100 MG tablet  buPROPion (WELLBUTRIN XL) 300 MG 24 hr tablet   Patient uses  [facility] [phone] - [place], [place] - [phone] AT [facility] [address]   Please advice [phone]

## 2020-10-05 ENCOUNTER — Ambulatory Visit (INDEPENDENT_AMBULATORY_CARE_PROVIDER_SITE_OTHER): Payer: Medicare Other | Admitting: Family Medicine

## 2020-10-05 ENCOUNTER — Other Ambulatory Visit: Payer: Self-pay

## 2020-10-05 ENCOUNTER — Encounter: Payer: Self-pay | Admitting: Family Medicine

## 2020-10-05 VITALS — BP 139/72 | HR 110 | Resp 16 | Wt 314.0 lb

## 2020-10-05 DIAGNOSIS — E1169 Type 2 diabetes mellitus with other specified complication: Secondary | ICD-10-CM | POA: Diagnosis not present

## 2020-10-05 DIAGNOSIS — E119 Type 2 diabetes mellitus without complications: Secondary | ICD-10-CM | POA: Diagnosis not present

## 2020-10-05 DIAGNOSIS — I10 Essential (primary) hypertension: Secondary | ICD-10-CM

## 2020-10-05 DIAGNOSIS — E230 Hypopituitarism: Secondary | ICD-10-CM

## 2020-10-05 DIAGNOSIS — R7989 Other specified abnormal findings of blood chemistry: Secondary | ICD-10-CM

## 2020-10-05 DIAGNOSIS — F339 Major depressive disorder, recurrent, unspecified: Secondary | ICD-10-CM

## 2020-10-05 DIAGNOSIS — Z6841 Body Mass Index (BMI) 40.0 and over, adult: Secondary | ICD-10-CM

## 2020-10-05 DIAGNOSIS — Z794 Long term (current) use of insulin: Secondary | ICD-10-CM | POA: Diagnosis not present

## 2020-10-05 LAB — POCT GLYCOSYLATED HEMOGLOBIN (HGB A1C): HbA1c, POC (controlled diabetic range): 12.2 % — AB (ref 0.0–7.0)

## 2020-10-05 LAB — GLUCOSE, POCT (MANUAL RESULT ENTRY): POC Glucose: 407 mg/dl — AB (ref 70–99)

## 2020-10-05 MED ORDER — INSULIN DETEMIR 100 UNIT/ML ~~LOC~~ SOLN: 15.0000 [IU] | Freq: Every day | SUBCUTANEOUS | 0 refills | Status: DC

## 2020-10-05 MED ORDER — "INSULIN SYRINGE 30G X 1/2"" 0.5 ML MISC": 0 refills | Status: DC

## 2020-10-05 MED ORDER — TESTOSTERONE CYPIONATE 200 MG/ML IM SOLN: 200.0000 mg | INTRAMUSCULAR | 1 refills | Status: DC

## 2020-10-05 MED ORDER — VALSARTAN-HYDROCHLOROTHIAZIDE 160-25 MG PO TABS: 1.0000 | ORAL_TABLET | Freq: Every day | ORAL | 1 refills | Status: DC

## 2020-10-05 MED ORDER — METFORMIN HCL 1000 MG PO TABS: ORAL_TABLET | ORAL | 1 refills | Status: DC

## 2020-10-06 LAB — MICROALBUMIN / CREATININE URINE RATIO
Creatinine, Urine: 61.1 mg/dL
Microalb/Creat Ratio: 423 mg/g creat — ABNORMAL HIGH (ref 0–29)
Microalbumin, Urine: 258.3 ug/mL

## 2020-10-06 NOTE — Progress Notes (Signed)
Established Patient Office Visit  Subjective:  Patient ID: Aaron Giles, male    DOB: October 11, 1960  Age: 60 y.o. MRN: [id number]  CC:  Chief Complaint  Patient presents with   Diabetes   Hypertension    HPI Aaron Giles presents for follow up of diabetes, hypertension, obesity, anxiety/depression, and hypogonadism.  Patient reports an increase in social stressors. He does not believe his psych meds are working especially now.   Past Medical History:  Diagnosis Date   ADHD    Anxiety    Arthritis    Carpal tunnel syndrome on right    Cellulitis    Cervical spondylosis    Chronic hepatitis C (HCC)    Chronic pain syndrome    COPD (chronic obstructive pulmonary disease) (HCC)    Depression    Dyslipidemia associated with type 2 diabetes mellitus (HCC)    GERD (gastroesophageal reflux disease)    History of colonic polyps    History of umbilical hernia    Hypertension    Hypogonadotropic hypogonadism (HCC)    Lumbar spondylosis    Neuromuscular disorder (HCC)    OSA (obstructive sleep apnea)    Peripheral autonomic neuropathy    Peripheral edema    Type 2 diabetes mellitus with diabetic neuropathy (HCC)    Venous stasis     Past Surgical History:  Procedure Laterality Date   APPENDECTOMY     groin abscess Right 2017   POSTERIOR LUMBAR FUSION     L3-4 fusion   removal of cyst from testicle Right    SMALL INTESTINE SURGERY     UMBILICAL HERNIA REPAIR      Family History  Adopted: Yes    Social History   Socioeconomic History   Marital status: Divorced    Spouse name: Not on file   Number of children: Not on file   Years of education: Not on file   Highest education level: Not on file  Occupational History   Not on file  Tobacco Use   Smoking status: Every Day    Packs/day: 0.50    Types: Cigarettes   Smokeless tobacco: Never  Vaping Use   Vaping Use: Never used  Substance and Sexual Activity   Alcohol use: Not Currently   Drug use: Never    Sexual activity: Not Currently  Other Topics Concern   Not on file  Social History Narrative   Not on file   Social Determinants of Health   Financial Resource Strain: Not on file  Food Insecurity: Not on file  Transportation Needs: Not on file  Physical Activity: Not on file  Stress: Not on file  Social Connections: Not on file  Intimate Partner Violence: Not on file    Outpatient Medications Prior to Visit  Medication Sig Dispense Refill   albuterol (VENTOLIN HFA) 108 (90 Base) MCG/ACT inhaler INHALE 2 PUFFS INTO THE LUNGS EVERY 6 HOURS AS NEEDED FOR WHEEZING OR SHORTNESS OF BREATH 6.7 g 3   B-D 3CC LUER-LOK SYR 18GX1-1/2 18G X 1-1/2" 3 ML MISC USE TO ADMINISTER TESTOSTERONE EVERY 14 DAYS.     buPROPion (WELLBUTRIN SR) 150 MG 12 hr tablet Take 1 tablet (150 mg total) by mouth at bedtime. 90 tablet 0   buPROPion (WELLBUTRIN XL) 300 MG 24 hr tablet TAKE 1 TABLET(300 MG) BY MOUTH DAILY 90 tablet 0   busPIRone (BUSPAR) 10 MG tablet TAKE 2 TABLETS(20 MG) BY MOUTH TWICE DAILY 180 tablet 0   HYDROcodone-acetaminophen (NORCO) 10-325 MG tablet Take  1 tablet by mouth 3 (three) times daily as needed.     meloxicam (MOBIC) 15 MG tablet TAKE 1/2 TO 1 TABLET(7.5 TO 15 MG) BY MOUTH DAILY AS NEEDED FOR PAIN 30 tablet 6   nabumetone (RELAFEN) 750 MG tablet TAKE 1 TABLET(750 MG) BY MOUTH TWICE DAILY AS NEEDED 60 tablet 6   NEEDLE, DISP, 22 G 22G X 1" MISC 1 Stick by Does not apply route every 14 (fourteen) days. 20 each 3   NEEDLE, DISP, 30 G 30G X 1" MISC 1 each by Does not apply route every 14 (fourteen) days. 100 each 0   pregabalin (LYRICA) 150 MG capsule Take 1 capsule (150 mg total) by mouth 2 (two) times daily. 60 capsule 0   rosuvastatin (CRESTOR) 20 MG tablet Take 1 tablet (20 mg total) by mouth daily. 90 tablet 3   sertraline (ZOLOFT) 100 MG tablet Take 1 tablet (100 mg total) by mouth daily. 90 tablet 0   Vitamin D, Ergocalciferol, (DRISDOL) 1.25 MG (50000 UNIT) CAPS capsule Take 1  capsule (50,000 Units total) by mouth every 7 (seven) days. 4 capsule 2   metFORMIN (GLUCOPHAGE) 1000 MG tablet TAKE 1 TABLET(1000 MG) BY MOUTH TWICE DAILY WITH A MEAL 180 tablet 1   testosterone cypionate (DEPOTESTOSTERONE CYPIONATE) 200 MG/ML injection Inject 1 mL (200 mg total) into the muscle every 14 (fourteen) days. 10 mL 1   valsartan-hydrochlorothiazide (DIOVAN-HCT) 160-25 MG tablet Take 1 tablet by mouth daily. 90 tablet 1   No facility-administered medications prior to visit.    No Known Allergies  ROS Review of Systems  Endocrine: Negative for polydipsia, polyphagia and polyuria.  Psychiatric/Behavioral:  Positive for sleep disturbance. Negative for self-injury and suicidal ideas. The patient is nervous/anxious.   All other systems reviewed and are negative.    Objective:    Physical Exam Vitals and nursing note reviewed.  Constitutional:      General: He is not in acute distress.    Appearance: He is obese.  Cardiovascular:     Rate and Rhythm: Normal rate and regular rhythm.  Pulmonary:     Effort: Pulmonary effort is normal.     Breath sounds: Normal breath sounds.  Abdominal:     Palpations: Abdomen is soft.     Tenderness: There is no abdominal tenderness.  Neurological:     General: No focal deficit present.     Mental Status: He is alert and oriented to person, place, and time.  Psychiatric:        Mood and Affect: Mood is anxious and depressed.        Speech: Speech normal.        Behavior: Behavior normal.        Thought Content: Thought content normal.    BP 139/72   Pulse (!) 110   Resp 16   Wt (!) 314 lb (142.4 kg)   SpO2 97%   BMI 42.59 kg/m  Wt Readings from Last 3 Encounters:  10/05/20 (!) 314 lb (142.4 kg)  06/01/20 (!) 328 lb (148.8 kg)  03/03/20 (!) 319 lb (144.7 kg)     Health Maintenance Due  Topic Date Due   OPHTHALMOLOGY EXAM  Never done   COLONOSCOPY (Pts 45-24yrs Insurance coverage will need to be confirmed)  Never done    Pneumococcal Vaccine 80-62 Years old (2 - PCV) 05/15/2017   COVID-19 Vaccine (3 - Pfizer risk series) 08/31/2019   INFLUENZA VACCINE  09/28/2020    There are no preventive  care reminders to display for this patient.       Lab Results  Component Value Date   HGBA1C 12.2 (A) 10/05/2020      Assessment & Plan:  Type 2 diabetes mellitus with other specified complication, with long-term current use of insulin (HCC) - Greatly increased A1c.  Will increase metformin 1000 mg from daily to twice daily.  We will start Levemir 15 units daily.  Referral to [facility] for management of initiation and titration of levemir- Plan: POCT glycosylated hemoglobin (Hb A1C), POCT glucose (manual entry), Microalbumin / creatinine urine ratio  Class 3 severe obesity due to excess calories with serious comorbidity and body mass index (BMI) of 40.0 to 44.9 in adult [facility]) - discussed dietary and acitivity options - goal is 5lbs/mo wt loss - monitor  Primary hypertension - appears stable  Major depression, recurrent, chronic (HCC) - referral to psychiatry for med management; referral to [facility] for counseling  - Plan: Ambulatory referral to Psychiatry  Low testosterone - refilled testosterone - Plan: testosterone cypionate (DEPOTESTOSTERONE CYPIONATE) 200 MG/ML injection  Meds ordered this encounter  Medications   testosterone cypionate (DEPOTESTOSTERONE CYPIONATE) 200 MG/ML injection    Sig: Inject 1 mL (200 mg total) into the muscle every 14 (fourteen) days.    Dispense:  10 mL    Refill:  1   metFORMIN (GLUCOPHAGE) 1000 MG tablet    Sig: TAKE 1 TABLET(1000 MG) BY MOUTH TWICE DAILY WITH A MEAL    Dispense:  180 tablet    Refill:  1   valsartan-hydrochlorothiazide (DIOVAN-HCT) 160-25 MG tablet    Sig: Take 1 tablet by mouth daily.    Dispense:  90 tablet    Refill:  1   insulin detemir (LEVEMIR) 100 UNIT/ML injection    Sig: Inject 0.15 mLs (15 Units total) into the skin daily.    Dispense:  10 mL     Refill:  0   Insulin Syringe-Needle U-100 (INSULIN SYRINGE .5CC/30GX1/2") 30G X 1/2" 0.5 ML MISC    Sig: Utilize as recommended daily to BID    Dispense:  100 each    Refill:  0    Follow-up: Return in about 6 weeks (around 11/16/2020) for follow up.    Tommie Raymond, MD

## 2020-10-07 DIAGNOSIS — G894 Chronic pain syndrome: Secondary | ICD-10-CM | POA: Diagnosis not present

## 2020-10-08 ENCOUNTER — Ambulatory Visit: Payer: Medicare Other | Admitting: Family

## 2020-10-13 DIAGNOSIS — M179 Osteoarthritis of knee, unspecified: Secondary | ICD-10-CM | POA: Diagnosis not present

## 2020-10-13 DIAGNOSIS — M5030 Other cervical disc degeneration, unspecified cervical region: Secondary | ICD-10-CM | POA: Diagnosis not present

## 2020-10-13 DIAGNOSIS — G894 Chronic pain syndrome: Secondary | ICD-10-CM | POA: Diagnosis not present

## 2020-10-13 DIAGNOSIS — M5136 Other intervertebral disc degeneration, lumbar region: Secondary | ICD-10-CM | POA: Diagnosis not present

## 2020-10-20 ENCOUNTER — Institutional Professional Consult (permissible substitution): Payer: Medicare Other | Admitting: Clinical

## 2020-10-23 ENCOUNTER — Ambulatory Visit: Payer: Medicare Other | Admitting: Family

## 2020-10-29 ENCOUNTER — Ambulatory Visit: Payer: Medicare Other | Admitting: Pharmacist

## 2020-11-12 ENCOUNTER — Ambulatory Visit: Payer: Medicare Other | Admitting: Family

## 2020-11-14 ENCOUNTER — Other Ambulatory Visit: Payer: Self-pay | Admitting: Family

## 2020-11-14 DIAGNOSIS — F339 Major depressive disorder, recurrent, unspecified: Secondary | ICD-10-CM

## 2020-11-16 ENCOUNTER — Ambulatory Visit: Payer: Medicare Other | Admitting: Family Medicine

## 2020-12-03 ENCOUNTER — Telehealth: Payer: Self-pay | Admitting: Family Medicine

## 2020-12-03 NOTE — Telephone Encounter (Signed)
Patient left message wanting to schedule an appointment for cough and med refill. Unable to reach patient, LVM

## 2020-12-07 ENCOUNTER — Ambulatory Visit (INDEPENDENT_AMBULATORY_CARE_PROVIDER_SITE_OTHER): Payer: Medicare Other

## 2020-12-07 ENCOUNTER — Encounter: Payer: Self-pay | Admitting: Family Medicine

## 2020-12-07 ENCOUNTER — Ambulatory Visit (INDEPENDENT_AMBULATORY_CARE_PROVIDER_SITE_OTHER): Payer: Medicare Other | Admitting: Family Medicine

## 2020-12-07 ENCOUNTER — Other Ambulatory Visit: Payer: Self-pay

## 2020-12-07 VITALS — BP 146/85 | HR 94 | Temp 98.3°F | Resp 18 | Wt 296.0 lb

## 2020-12-07 DIAGNOSIS — E1169 Type 2 diabetes mellitus with other specified complication: Secondary | ICD-10-CM | POA: Diagnosis not present

## 2020-12-07 DIAGNOSIS — I10 Essential (primary) hypertension: Secondary | ICD-10-CM | POA: Diagnosis not present

## 2020-12-07 DIAGNOSIS — F339 Major depressive disorder, recurrent, unspecified: Secondary | ICD-10-CM

## 2020-12-07 DIAGNOSIS — Z794 Long term (current) use of insulin: Secondary | ICD-10-CM

## 2020-12-07 DIAGNOSIS — R7989 Other specified abnormal findings of blood chemistry: Secondary | ICD-10-CM

## 2020-12-07 DIAGNOSIS — R052 Subacute cough: Secondary | ICD-10-CM | POA: Diagnosis not present

## 2020-12-07 DIAGNOSIS — K219 Gastro-esophageal reflux disease without esophagitis: Secondary | ICD-10-CM

## 2020-12-07 MED ORDER — INSULIN DETEMIR 100 UNIT/ML ~~LOC~~ SOLN: 15.0000 [IU] | Freq: Every day | SUBCUTANEOUS | 0 refills | Status: DC

## 2020-12-07 MED ORDER — TESTOSTERONE CYPIONATE 200 MG/ML IM SOLN: 200.0000 mg | INTRAMUSCULAR | 1 refills | Status: DC

## 2020-12-07 MED ORDER — OMEPRAZOLE 40 MG PO CPDR: 40.0000 mg | DELAYED_RELEASE_CAPSULE | Freq: Every day | ORAL | 3 refills | Status: DC

## 2020-12-07 NOTE — Progress Notes (Signed)
Patient is concern about lung and thyroid cancer. Patient would like a referral for scans  Patient said he was expose to black mole with sx of cough and congestion and no fever.

## 2020-12-08 NOTE — Progress Notes (Signed)
Established  Patient Office Visit  Subjective:  Patient ID: Aaron Giles, male    DOB: 1960/06/09  Age: 60 y.o. MRN: [id number]  CC:  Chief Complaint  Patient presents with   Cough    Black mole   Nasal Congestion    HPI Aaron Giles presents for follow up of chronic med issues including hypertension. Patient also reports that he has had a cough for a couple of weeks. Denies fever/chills or viral sx.   Past Medical History:  Diagnosis Date   ADHD    Anxiety    Arthritis    Carpal tunnel syndrome on right    Cellulitis    Cervical spondylosis    Chronic hepatitis C (HCC)    Chronic pain syndrome    COPD (chronic obstructive pulmonary disease) (HCC)    Depression    Dyslipidemia associated with type 2 diabetes mellitus (HCC)    GERD (gastroesophageal reflux disease)    History of colonic polyps    History of umbilical hernia    Hypertension    Hypogonadotropic hypogonadism (HCC)    Lumbar spondylosis    Neuromuscular disorder (HCC)    OSA (obstructive sleep apnea)    Peripheral autonomic neuropathy    Peripheral edema    Type 2 diabetes mellitus with diabetic neuropathy (HCC)    Venous stasis      Social History   Socioeconomic History   Marital status: Divorced    Spouse name: Not on file   Number of children: Not on file   Years of education: Not on file   Highest education level: Not on file  Occupational History   Not on file  Tobacco Use   Smoking status: Every Day    Packs/day: 0.50    Types: Cigarettes   Smokeless tobacco: Never  Vaping Use   Vaping Use: Never used  Substance and Sexual Activity   Alcohol use: Not Currently   Drug use: Never   Sexual activity: Not Currently  Other Topics Concern   Not on file  Social History Narrative   Not on file   Social Determinants of Health   Financial Resource Strain: Not on file  Food Insecurity: Not on file  Transportation Needs: Not on file  Physical Activity: Not on file  Stress:  Not on file  Social Connections: Not on file  Intimate Partner Violence: Not on file    ROS Review of Systems  Constitutional:  Negative for fever.  HENT:  Positive for congestion.   Respiratory:  Positive for cough.   All other systems reviewed and are negative.  Objective:   Today's Vitals: BP (!) 146/85   Pulse 94   Temp 98.3 F (36.8 C) (Oral)   Resp 18   Wt 296 lb (134.3 kg)   SpO2 93%   BMI 40.14 kg/m   Physical Exam Vitals and nursing note reviewed.  Constitutional:      General: He is not in acute distress.    Appearance: He is obese.  Cardiovascular:     Rate and Rhythm: Normal rate and regular rhythm.  Pulmonary:     Effort: Pulmonary effort is normal. No respiratory distress.     Breath sounds: Rhonchi present.  Abdominal:     Palpations: Abdomen is soft.     Tenderness: There is no abdominal tenderness.  Neurological:     General: No focal deficit present.     Mental Status: He is alert and oriented to person, place, and time.  Psychiatric:        Mood and Affect: Mood is anxious and depressed.        Speech: Speech normal.        Behavior: Behavior normal.        Thought Content: Thought content normal.    Assessment & Plan:   1. Primary hypertension Slightly elevated readings. Continue present management and monitor  2. Low testosterone Testosterone refilled - testosterone cypionate (DEPOTESTOSTERONE CYPIONATE) 200 MG/ML injection; Inject 1 mL (200 mg total) into the muscle every 14 (fourteen) days.  Dispense: 10 mL; Refill: 1  3. Type 2 diabetes mellitus with other specified complication, with long-term current use of insulin (HCC) Reports much improvement. Continue present management - insulin detemir (LEVEMIR) 100 UNIT/ML injection; Inject 0.15 mLs (15 Units total) into the skin daily.  Dispense: 10 mL; Refill: 0  4. Major depression, recurrent, chronic (HCC) Referral to [facility] for med management - Ambulatory referral to Psychiatry  5.  Gastroesophageal reflux disease without esophagitis Discussed dietary and activity options. Omeprazole 40 mg prescribed - omeprazole (PRILOSEC) 40 MG capsule; Take 1 capsule (40 mg total) by mouth daily.  Dispense: 30 capsule; Refill: 3  6. Subacute cough Chest xray ordered - DG Chest 2 View; Future    Outpatient Encounter Medications as of 12/07/2020  Medication Sig   albuterol (VENTOLIN HFA) 108 (90 Base) MCG/ACT inhaler INHALE 2 PUFFS INTO THE LUNGS EVERY 6 HOURS AS NEEDED FOR WHEEZING OR SHORTNESS OF BREATH   B-D 3CC LUER-LOK SYR 18GX1-1/2 18G X 1-1/2" 3 ML MISC USE TO ADMINISTER TESTOSTERONE EVERY 14 DAYS.   buPROPion (WELLBUTRIN SR) 150 MG 12 hr tablet Take 1 tablet (150 mg total) by mouth at bedtime.   buPROPion (WELLBUTRIN XL) 300 MG 24 hr tablet TAKE 1 TABLET(300 MG) BY MOUTH DAILY   busPIRone (BUSPAR) 10 MG tablet TAKE 2 TABLETS(20 MG) BY MOUTH TWICE DAILY   HYDROcodone-acetaminophen (NORCO) 10-325 MG tablet Take 1 tablet by mouth 3 (three) times daily as needed.   Insulin Syringe-Needle U-100 (INSULIN SYRINGE .5CC/30GX1/2") 30G X 1/2" 0.5 ML MISC Utilize as recommended daily to BID   meloxicam (MOBIC) 15 MG tablet TAKE 1/2 TO 1 TABLET(7.5 TO 15 MG) BY MOUTH DAILY AS NEEDED FOR PAIN   metFORMIN (GLUCOPHAGE) 1000 MG tablet TAKE 1 TABLET(1000 MG) BY MOUTH TWICE DAILY WITH A MEAL   nabumetone (RELAFEN) 750 MG tablet TAKE 1 TABLET(750 MG) BY MOUTH TWICE DAILY AS NEEDED   NEEDLE, DISP, 22 G 22G X 1" MISC 1 Stick by Does not apply route every 14 (fourteen) days.   NEEDLE, DISP, 30 G 30G X 1" MISC 1 each by Does not apply route every 14 (fourteen) days.   omeprazole (PRILOSEC) 40 MG capsule Take 1 capsule (40 mg total) by mouth daily.   pregabalin (LYRICA) 150 MG capsule Take 1 capsule (150 mg total) by mouth 2 (two) times daily.   rosuvastatin (CRESTOR) 20 MG tablet Take 1 tablet (20 mg total) by mouth daily.   sertraline (ZOLOFT) 100 MG tablet Take 1 tablet (100 mg total) by mouth  daily.   valsartan-hydrochlorothiazide (DIOVAN-HCT) 160-25 MG tablet Take 1 tablet by mouth daily.   Vitamin D, Ergocalciferol, (DRISDOL) 1.25 MG (50000 UNIT) CAPS capsule Take 1 capsule (50,000 Units total) by mouth every 7 (seven) days.   [DISCONTINUED] insulin detemir (LEVEMIR) 100 UNIT/ML injection Inject 0.15 mLs (15 Units total) into the skin daily.   [DISCONTINUED] testosterone cypionate (DEPOTESTOSTERONE CYPIONATE) 200 MG/ML injection Inject 1 mL (  200 mg total) into the muscle every 14 (fourteen) days.   insulin detemir (LEVEMIR) 100 UNIT/ML injection Inject 0.15 mLs (15 Units total) into the skin daily.   testosterone cypionate (DEPOTESTOSTERONE CYPIONATE) 200 MG/ML injection Inject 1 mL (200 mg total) into the muscle every 14 (fourteen) days.   No facility-administered encounter medications on file as of 12/07/2020.    Follow-up: No follow-ups on file.   Tommie Raymond, MD

## 2020-12-23 ENCOUNTER — Other Ambulatory Visit: Payer: Self-pay | Admitting: Family

## 2020-12-23 DIAGNOSIS — F339 Major depressive disorder, recurrent, unspecified: Secondary | ICD-10-CM

## 2021-01-27 ENCOUNTER — Ambulatory Visit: Payer: Medicare Other | Admitting: Family Medicine

## 2021-05-28 ENCOUNTER — Ambulatory Visit (INDEPENDENT_AMBULATORY_CARE_PROVIDER_SITE_OTHER): Payer: Medicare Other

## 2021-05-28 VITALS — Ht 72.0 in

## 2021-05-28 DIAGNOSIS — Z0000 Encounter for general adult medical examination without abnormal findings: Secondary | ICD-10-CM | POA: Diagnosis not present

## 2021-05-28 NOTE — Progress Notes (Addendum)
? ?Subjective:  ? Aaron Giles is a 61 y.o. male who presents for an Initial Medicare Annual Wellness Visit. ? ?I connected with  Kent Abdou on 05/28/2021 a audio enabled telemedicine application and verified that I am speaking with the correct person using two identifiers. ? ?Patient Location: Home ? ?Provider Location: Office/Clinic ? ?I discussed the limitations of evaluation and management by telemedicine. The patient expressed understanding and agreed to proceed.  ?Review of Systems    ? ?Cardiac Risk Factors include: advanced age (>64men, >23 women) ? ?   ?Objective:  ?  ?Today's Vitals  ? 05/28/21 1423  ?Height: 6' (1.829 m)  ? ?Body mass index is 40.14 kg/m?. ? ? ?  05/28/2021  ?  2:16 PM 03/03/2020  ?  9:12 PM 12/23/2019  ?  8:28 PM  ?Advanced Directives  ?Does Patient Have a Medical Advance Directive? No No No  ?Would patient like information on creating a medical advance directive? No - Patient declined No - Patient declined No - Patient declined  ? ? ?Current Medications (verified) ?Outpatient Encounter Medications as of 05/28/2021  ?Medication Sig  ? albuterol (VENTOLIN HFA) 108 (90 Base) MCG/ACT inhaler INHALE 2 PUFFS INTO THE LUNGS EVERY 6 HOURS AS NEEDED FOR WHEEZING OR SHORTNESS OF BREATH  ? B-D 3CC LUER-LOK SYR 18GX1-1/2 18G X 1-1/2" 3 ML MISC USE TO ADMINISTER TESTOSTERONE EVERY 14 DAYS.  ? buPROPion (WELLBUTRIN SR) 150 MG 12 hr tablet Take 1 tablet (150 mg total) by mouth at bedtime.  ? buPROPion (WELLBUTRIN XL) 300 MG 24 hr tablet TAKE 1 TABLET(300 MG) BY MOUTH DAILY  ? busPIRone (BUSPAR) 10 MG tablet TAKE 2 TABLETS(20 MG) BY MOUTH TWICE DAILY  ? HYDROcodone-acetaminophen (NORCO) 10-325 MG tablet Take 1 tablet by mouth 3 (three) times daily as needed.  ? insulin detemir (LEVEMIR) 100 UNIT/ML injection Inject 0.15 mLs (15 Units total) into the skin daily.  ? Insulin Syringe-Needle U-100 (INSULIN SYRINGE .5CC/30GX1/2") 30G X 1/2" 0.5 ML MISC Utilize as recommended daily to BID  ?  meloxicam (MOBIC) 15 MG tablet TAKE 1/2 TO 1 TABLET(7.5 TO 15 MG) BY MOUTH DAILY AS NEEDED FOR PAIN  ? metFORMIN (GLUCOPHAGE) 1000 MG tablet TAKE 1 TABLET(1000 MG) BY MOUTH TWICE DAILY WITH A MEAL  ? nabumetone (RELAFEN) 750 MG tablet TAKE 1 TABLET(750 MG) BY MOUTH TWICE DAILY AS NEEDED  ? NEEDLE, DISP, 22 G 22G X 1" MISC 1 Stick by Does not apply route every 14 (fourteen) days.  ? NEEDLE, DISP, 30 G 30G X 1" MISC 1 each by Does not apply route every 14 (fourteen) days.  ? omeprazole (PRILOSEC) 40 MG capsule Take 1 capsule (40 mg total) by mouth daily.  ? pregabalin (LYRICA) 150 MG capsule Take 1 capsule (150 mg total) by mouth 2 (two) times daily.  ? rosuvastatin (CRESTOR) 20 MG tablet Take 1 tablet (20 mg total) by mouth daily.  ? sertraline (ZOLOFT) 100 MG tablet Take 1 tablet (100 mg total) by mouth daily.  ? testosterone cypionate (DEPOTESTOSTERONE CYPIONATE) 200 MG/ML injection Inject 1 mL (200 mg total) into the muscle every 14 (fourteen) days.  ? valsartan-hydrochlorothiazide (DIOVAN-HCT) 160-25 MG tablet Take 1 tablet by mouth daily.  ? Vitamin D, Ergocalciferol, (DRISDOL) 1.25 MG (50000 UNIT) CAPS capsule Take 1 capsule (50,000 Units total) by mouth every 7 (seven) days.  ? ?No facility-administered encounter medications on file as of 05/28/2021.  ? ? ?Allergies (verified) ?Patient has no known allergies.  ? ?History: ?Past Medical History:  ?  Diagnosis Date  ? ADHD   ? Anxiety   ? Arthritis   ? Carpal tunnel syndrome on right   ? Cellulitis   ? Cervical spondylosis   ? Chronic hepatitis C (HCC)   ? Chronic pain syndrome   ? COPD (chronic obstructive pulmonary disease) (HCC)   ? Depression   ? Dyslipidemia associated with type 2 diabetes mellitus (HCC)   ? GERD (gastroesophageal reflux disease)   ? History of colonic polyps   ? History of umbilical hernia   ? Hypertension   ? Hypogonadotropic hypogonadism (HCC)   ? Lumbar spondylosis   ? Neuromuscular disorder (HCC)   ? OSA (obstructive sleep apnea)   ?  Peripheral autonomic neuropathy   ? Peripheral edema   ? Type 2 diabetes mellitus with diabetic neuropathy (HCC)   ? Venous stasis   ? ?Past Surgical History:  ?Procedure Laterality Date  ? APPENDECTOMY    ? groin abscess Right 2017  ? POSTERIOR LUMBAR FUSION    ? L3-4 fusion  ? removal of cyst from testicle Right   ? SMALL INTESTINE SURGERY    ? UMBILICAL HERNIA REPAIR    ? ?Family History  ?Adopted: Yes  ? ?Social History  ? ?Socioeconomic History  ? Marital status: Divorced  ?  Spouse name: Not on file  ? Number of children: Not on file  ? Years of education: Not on file  ? Highest education level: Not on file  ?Occupational History  ? Not on file  ?Tobacco Use  ? Smoking status: Every Day  ?  Packs/day: 0.25  ?  Years: 45.00  ?  Pack years: 11.25  ?  Types: Cigarettes  ? Smokeless tobacco: Never  ?Vaping Use  ? Vaping Use: Never used  ?Substance and Sexual Activity  ? Alcohol use: Not Currently  ? Drug use: Never  ? Sexual activity: Not Currently  ?Other Topics Concern  ? Not on file  ?Social History Narrative  ? Not on file  ? ?Social Determinants of Health  ? ?Financial Resource Strain: High Risk  ? Difficulty of Paying Living Expenses: Hard  ?Food Insecurity: Food Insecurity Present  ? Worried About Programme researcher, broadcasting/film/videounning Out of Food in the Last Year: Often true  ? Ran Out of Food in the Last Year: Often true  ?Transportation Needs: No Transportation Needs  ? Lack of Transportation (Medical): No  ? Lack of Transportation (Non-Medical): No  ?Physical Activity: Inactive  ? Days of Exercise per Week: 0 days  ? Minutes of Exercise per Session: 0 min  ?Stress: Stress Concern Present  ? Feeling of Stress : Very much  ?Social Connections: Socially Isolated  ? Frequency of Communication with Friends and Family: Never  ? Frequency of Social Gatherings with Friends and Family: Never  ? Attends Religious Services: Never  ? Active Member of Clubs or Organizations: No  ? Attends BankerClub or Organization Meetings: Never  ? Marital Status:  Divorced  ? ? ?Tobacco Counseling ?Ready to quit: Not Answered ?Counseling given: Not Answered ? ? ?Clinical Intake: ? ?  ? ?  ? ?  ? ?  ? ?How often do you need to have someone help you when you read instructions, pamphlets, or other written materials from your doctor or pharmacy?: (P) 3 - Sometimes ? ?Diabetic? Yes ? ?  ? ?  ? ? ?Activities of Daily Living ? ?  05/28/2021  ?  2:16 PM 05/27/2021  ?  2:52 PM  ?In your present state  of health, do you have any difficulty performing the following activities:  ?Hearing? 1 1  ?Vision? 0 0  ?Difficulty concentrating or making decisions? 1 1  ?Walking or climbing stairs? 1 1  ?Dressing or bathing? 1 1  ?Doing errands, shopping? 0 0  ?Preparing Food and eating ? N N  ?Using the Toilet? N N  ?In the past six months, have you accidently leaked urine? N N  ?Do you have problems with loss of bowel control? N N  ?Managing your Medications? N N  ?Managing your Finances? N Y  ?Housekeeping or managing your Housekeeping? N Y  ? ? ?Patient Care Team: ?Georganna Skeans, MD as PCP - General (Family Medicine) ? ?Indicate any recent Medical Services you may have received from other than [facility] providers in the past year (date may be approximate). ? ?   ?Assessment:  ? This is a routine wellness examination for Aaron Giles. ? ?Hearing/Vision screen ?No results found. ? ?Dietary issues and exercise activities discussed: ?Current Exercise Habits: The patient does not participate in regular exercise at present ? ? Goals Addressed   ? ?  ?  ?  ?  ? This Visit's Progress  ?  DIET - REDUCE PORTION SIZE     ? ?  ? ?Depression Screen ? ?  05/28/2021  ?  1:59 PM 12/07/2020  ?  3:01 PM 10/05/2020  ? 10:33 AM 06/01/2020  ?  2:08 PM 01/09/2020  ?  9:36 AM 11/14/2019  ?  1:45 PM 09/12/2019  ?  2:28 PM  ?PHQ 2/9 Scores  ?PHQ - 2 Score 6 5 6 6 2 2 6   ?PHQ- 9 Score 18 21 24 27 8 8 24   ?  ?Fall Risk ? ?  05/27/2021  ?  2:52 PM 10/05/2020  ? 10:33 AM 06/01/2020  ?  2:08 PM 01/09/2020  ?  9:36 AM 09/12/2019  ?  1:50 PM  ?Fall  Risk   ?Falls in the past year? 1 0 0 0 0  ?Number falls in past yr: 1 0 0 0 0  ?Injury with Fall? 1 0 0 0 0  ?Risk for fall due to :  No Fall Risks No Fall Risks    ?Follow up   Falls evaluation completed Falls eval

## 2021-05-31 ENCOUNTER — Other Ambulatory Visit: Payer: Self-pay | Admitting: Family Medicine

## 2021-05-31 DIAGNOSIS — E119 Type 2 diabetes mellitus without complications: Secondary | ICD-10-CM

## 2021-05-31 NOTE — Telephone Encounter (Signed)
Medication Refill - Medication:metFORMIN (GLUCOPHAGE) 1000 MG tablet  ? ?Has the patient contacted their pharmacy? yes ?(Agent: If no, request that the patient contact the pharmacy for the refill. If patient does not wish to contact the pharmacy document the reason why and proceed with request.) ?(Agent: If yes, when and what did the pharmacy advise?)contact pcp ? ?Preferred Pharmacy (with phone number or street name):  ?[facility] [phone] [place], [place] - [address] AT [facility] [address] Phone:  [phone]  ?Fax:  [phone]  ?  ? ?Has the patient been seen for an appointment in the last year OR does the patient have an upcoming appointment? yes ? ?Agent: Please be advised that RX refills may take up to 3 business days. We ask that you follow-up with your pharmacy.  ?

## 2021-06-01 NOTE — Telephone Encounter (Signed)
Requested medication (s) are due for refill today: yes ? ?Requested medication (s) are on the active medication list: yes ? ?Last refill:  10/05/20 #180/1 ? ?Future visit scheduled: no ? ?Notes to clinic:  Unable to refill per protocol due to failed labs, no updated results. ? ? ?  ?Requested Prescriptions  ?Pending Prescriptions Disp Refills  ? metFORMIN (GLUCOPHAGE) 1000 MG tablet 180 tablet 1  ?  Sig: TAKE 1 TABLET(1000 MG) BY MOUTH TWICE DAILY WITH A MEAL  ?  ? Endocrinology:  Diabetes - Biguanides Failed - 06/01/2021  9:52 AM  ?  ?  Failed - Cr in normal range and within 360 days  ?  Creatinine, Ser  ?Date Value Ref Range Status  ?06/01/2020 0.94 0.76 - 1.27 mg/dL Final  ?  ?  ?  ?  Failed - HBA1C is between 0 and 7.9 and within 180 days  ?  HbA1c, POC (controlled diabetic range)  ?Date Value Ref Range Status  ?10/05/2020 12.2 (A) 0.0 - 7.0 % Final  ?  ?  ?  ?  Failed - eGFR in normal range and within 360 days  ?  GFR calc Af Amer  ?Date Value Ref Range Status  ?01/30/2020 89 >59 mL/min/1.73 Final  ?  Comment:  ?  **In accordance with recommendations from the NKF-ASN Task force,** ?  [facility] is in the process of updating its eGFR calculation to the ?  2021 CKD-EPI creatinine equation that estimates kidney function ?  without a race variable. ?  ? ?GFR calc non Af Amer  ?Date Value Ref Range Status  ?01/30/2020 77 >59 mL/min/1.73 Final  ? ?eGFR  ?Date Value Ref Range Status  ?06/01/2020 94 >59 mL/min/1.73 Final  ?  ?  ?  ?  Failed - CBC within normal limits and completed in the last 12 months  ?  WBC  ?Date Value Ref Range Status  ?01/30/2020 6.7 3.4 - 10.8 x10E3/uL Final  ?03/14/2019 5.6 4.0 - 10.5 K/uL Final  ? ?RBC  ?Date Value Ref Range Status  ?01/30/2020 4.98 4.14 - 5.80 x10E6/uL Final  ?03/14/2019 4.38 4.22 - 5.81 Mil/uL Final  ? ?Hemoglobin  ?Date Value Ref Range Status  ?01/30/2020 15.5 13.0 - 17.7 g/dL Final  ? ?Hematocrit  ?Date Value Ref Range Status  ?01/30/2020 45.7 37.5 - 51.0 % Final  ? ?MCHC  ?Date  Value Ref Range Status  ?01/30/2020 33.9 31.5 - 35.7 g/dL Final  ?03/14/2019 33.5 30.0 - 36.0 g/dL Final  ? ?MCH  ?Date Value Ref Range Status  ?01/30/2020 31.1 26.6 - 33.0 pg Final  ? ?MCV  ?Date Value Ref Range Status  ?01/30/2020 92 79 - 97 fL Final  ? ?No results found for: PLTCOUNTKUC, LABPLAT, [place] ?RDW  ?Date Value Ref Range Status  ?01/30/2020 13.0 11.6 - 15.4 % Final  ? ?  ?  ?  Passed - B12 Level in normal range and within 720 days  ?  Vitamin B-12  ?Date Value Ref Range Status  ?06/01/2020 671 232 - 1,245 pg/mL Final  ?  ?  ?  ?  Passed - Valid encounter within last 6 months  ?  Recent Outpatient Visits   ? ?      ? 5 months ago Primary hypertension  ? Primary Care at [facility], [place]  ? 7 months ago Type 2 diabetes mellitus with other specified complication, with long-term current use of insulin ([place])  ? Primary Care at [facility]  Dorna Mai, [place]  ? 1 year ago Type 2 diabetes mellitus without complication, without long-term current use of insulin ([place])  ? Primary Care at [facility], Bayard Beaver, MD  ? 1 year ago Type 2 diabetes mellitus without complication, without long-term current use of insulin ([place])  ? Primary Care at [facility], Bayard Beaver, MD  ? 1 year ago Type 2 diabetes mellitus without complication, without long-term current use of insulin ([place])  ? Primary Care at [place], PA-C  ? ?  ?  ? ?  ?  ?  ? ?

## 2021-06-17 NOTE — Addendum Note (Signed)
Addended by: Mariam Dollar on: 06/17/2021 01:47 PM ? ? Modules accepted: Level of Service ? ?

## 2021-06-29 ENCOUNTER — Telehealth: Payer: Self-pay

## 2021-06-29 NOTE — Telephone Encounter (Signed)
I left the patient a message that it was some questions that needed to be answered from his AWV visit that he had in March. I asked that the patient call back and the call back number was provided.  ?

## 2021-07-16 ENCOUNTER — Other Ambulatory Visit: Payer: Self-pay | Admitting: Family Medicine

## 2021-07-16 NOTE — Telephone Encounter (Signed)
Requested medication (s) are due for refill today: yes  Requested medication (s) are on the active medication list: yes  Last refill:  06/04/20  Future visit scheduled: no  Notes to clinic:  overdue lab work   Requested Prescriptions  Pending Prescriptions Disp Refills   rosuvastatin (CRESTOR) 20 MG tablet [Pharmacy Med Name: ROSUVASTATIN 20MG  TABLETS] 90 tablet 3    Sig: TAKE 1 TABLET(20 MG) BY MOUTH DAILY     Cardiovascular:  Antilipid - Statins 2 Failed - 07/16/2021  2:16 PM      Failed - Cr in normal range and within 360 days    Creatinine, Ser  Date Value Ref Range Status  06/01/2020 0.94 0.76 - 1.27 mg/dL Final         Failed - Lipid Panel in normal range within the last 12 months    Cholesterol, Total  Date Value Ref Range Status  06/01/2020 145 100 - 199 mg/dL Final   LDL Chol Calc (NIH)  Date Value Ref Range Status  06/01/2020 69 0 - 99 mg/dL Final   HDL  Date Value Ref Range Status  06/01/2020 39 (L) >39 mg/dL Final   Triglycerides  Date Value Ref Range Status  06/01/2020 225 (H) 0 - 149 mg/dL Final         Passed - Patient is not pregnant      Passed - Valid encounter within last 12 months    Recent Outpatient Visits           7 months ago Primary hypertension   Primary Care at [facility], [facility], [place]   9 months ago Type 2 diabetes mellitus with other specified complication, with long-term current use of insulin [facility])   Primary Care at [facility], [facility], [place]   1 year ago Type 2 diabetes mellitus without complication, without long-term current use of insulin [facility])   Primary Care at [facility], [facility], [place]   1 year ago Type 2 diabetes mellitus without complication, without long-term current use of insulin [facility])   Primary Care at [facility], [facility], [place]   1 year ago Type 2 diabetes mellitus without complication, without long-term current use of insulin [facility])   Primary Care at [facility], [place], [place]

## 2021-08-05 ENCOUNTER — Other Ambulatory Visit: Payer: Self-pay

## 2021-08-05 DIAGNOSIS — E785 Hyperlipidemia, unspecified: Secondary | ICD-10-CM

## 2021-08-05 MED ORDER — ROSUVASTATIN CALCIUM 20 MG PO TABS: 20.0000 mg | ORAL_TABLET | Freq: Every day | ORAL | 3 refills | Status: AC

## 2021-08-27 ENCOUNTER — Ambulatory Visit: Payer: Self-pay | Admitting: *Deleted

## 2021-08-27 ENCOUNTER — Other Ambulatory Visit: Payer: Self-pay | Admitting: *Deleted

## 2021-08-27 DIAGNOSIS — I10 Essential (primary) hypertension: Secondary | ICD-10-CM

## 2021-08-27 MED ORDER — VALSARTAN-HYDROCHLOROTHIAZIDE 160-25 MG PO TABS: 1.0000 | ORAL_TABLET | Freq: Every day | ORAL | 0 refills | Status: DC

## 2021-08-27 NOTE — Telephone Encounter (Signed)
Courtesy refill.  Requested Prescriptions  Pending Prescriptions Disp Refills  . valsartan-hydrochlorothiazide (DIOVAN-HCT) 160-25 MG tablet 90 tablet 0    Sig: Take 1 tablet by mouth daily.     Cardiovascular: ARB + Diuretic Combos Failed - 08/27/2021  3:00 PM      Failed - K in normal range and within 180 days    Potassium  Date Value Ref Range Status  06/01/2020 4.3 3.5 - 5.2 mmol/L Final         Failed - Na in normal range and within 180 days    Sodium  Date Value Ref Range Status  06/01/2020 139 134 - 144 mmol/L Final         Failed - Cr in normal range and within 180 days    Creatinine, Ser  Date Value Ref Range Status  06/01/2020 0.94 0.76 - 1.27 mg/dL Final         Failed - eGFR is 10 or above and within 180 days    GFR calc Af Amer  Date Value Ref Range Status  01/30/2020 89 >59 mL/min/1.73 Final    Comment:    **In accordance with recommendations from the NKF-ASN Task force,**   [facility] is in the process of updating its eGFR calculation to the   2021 CKD-EPI creatinine equation that estimates kidney function   without a race variable.    GFR calc non Af Amer  Date Value Ref Range Status  01/30/2020 77 >59 mL/min/1.73 Final   eGFR  Date Value Ref Range Status  06/01/2020 94 >59 mL/min/1.73 Final         Failed - Last BP in normal range    BP Readings from Last 1 Encounters:  12/07/20 (!) 146/85         Failed - Valid encounter within last 6 months    Recent Outpatient Visits          8 months ago Primary hypertension   Primary Care at [facility], Clyde Canterbury, MD   10 months ago Type 2 diabetes mellitus with other specified complication, with long-term current use of insulin [facility])   Primary Care at [facility], MD   1 year ago Type 2 diabetes mellitus without complication, without long-term current use of insulin [facility])   Primary Care at [facility], Bayard Beaver, MD   1 year ago Type 2 diabetes mellitus without  complication, without long-term current use of insulin [facility])   Primary Care at [facility], Bayard Beaver, MD   1 year ago Type 2 diabetes mellitus without complication, without long-term current use of insulin [facility])   Primary Care at [facility], Loraine Grip, PA-C      Future Appointments            In 1 week Dorna Mai, MD Primary Care at [facility] - Patient is not pregnant

## 2021-08-27 NOTE — Telephone Encounter (Signed)
  Chief Complaint: legs swelling requesting medication valsartan and referral for neurologist from  pain clinic dr Symptoms: bilateral feet to knees swollen. Red, blisters draining yellow fluid. Pt reports not new. Requesting refill on valsartan, and c/o sore on tongue did not heal. Requested appt. Frequency: 5-6 years  Pertinent Negatives: Patient denies chest pain no difficult breathing no fever Disposition: [] ED /[] Urgent Care (no appt availability in office) / [x] Appointment(In office/virtual)/ []  [facility]/ [] Home Care/ [] Refused Recommended Disposition /[] [facility]/ []  Follow-up with PCP Additional Notes:   Please advise if earlier appt available with PCP.      Reason for Disposition  [1] MODERATE leg swelling (e.g., swelling extends up to knees) AND [2] new-onset or worsening  Answer Assessment - Initial Assessment Questions 1. ONSET: "When did the swelling start?" (e.g., minutes, hours, days)     5-6 years ago  2. LOCATION: "What part of the leg is swollen?"  "Are both legs swollen or just one leg?"    Foot to knees  3. SEVERITY: "How bad is the swelling?" (e.g., localized; mild, moderate, severe)  - Localized - small area of swelling localized to one leg  - MILD pedal edema - swelling limited to foot and ankle, pitting edema < 1/4 inch (6 mm) deep, rest and elevation eliminate most or all swelling  - MODERATE edema - swelling of lower leg to knee, pitting edema > 1/4 inch (6 mm) deep, rest and elevation only partially reduce swelling  - SEVERE edema - swelling extends above knee, facial or hand swelling present   C/o redness and blisters draining yellow fluid . Swelling not new  4. REDNESS: "Does the swelling look red or infected?"     Red and blisters with yellow fluid draining  5. PAIN: "Is the swelling painful to touch?" If Yes, ask: "How painful is it?"   (Scale 1-10; mild, moderate or severe)     Yes 6. FEVER: "Do you have a fever?" If Yes, ask:  "What is it, how was it measured, and when did it start?"      No  7. CAUSE: "What do you think is causing the leg swelling?"     Na  8. MEDICAL HISTORY: "Do you have a history of heart failure, kidney disease, liver failure, or cancer?"     See hx  9. RECURRENT SYMPTOM: "Have you had leg swelling before?" If Yes, ask: "When was the last time?" "What happened that time?"     Yes x 5-6 years  10. OTHER SYMPTOMS: "Do you have any other symptoms?" (e.g., chest pain, difficulty breathing)       No  11. PREGNANCY: "Is there any chance you are pregnant?" "When was your last menstrual period?"       na  Protocols used: Leg Swelling and Edema-A-AH

## 2021-08-27 NOTE — Telephone Encounter (Signed)
LOV 12/07/2020  I have attempted without success to contact this patient by phone to I left a message on answering machine Also advise going to mobile bus.

## 2021-09-08 ENCOUNTER — Ambulatory Visit (INDEPENDENT_AMBULATORY_CARE_PROVIDER_SITE_OTHER): Payer: Medicare Other | Admitting: Family Medicine

## 2021-09-08 ENCOUNTER — Encounter: Payer: Self-pay | Admitting: Family Medicine

## 2021-09-08 VITALS — BP 142/75 | HR 93 | Temp 98.1°F | Resp 18 | Wt 303.6 lb

## 2021-09-08 DIAGNOSIS — Z09 Encounter for follow-up examination after completed treatment for conditions other than malignant neoplasm: Secondary | ICD-10-CM

## 2021-09-08 DIAGNOSIS — R7989 Other specified abnormal findings of blood chemistry: Secondary | ICD-10-CM | POA: Diagnosis not present

## 2021-09-08 DIAGNOSIS — E1169 Type 2 diabetes mellitus with other specified complication: Secondary | ICD-10-CM | POA: Diagnosis not present

## 2021-09-08 DIAGNOSIS — Z794 Long term (current) use of insulin: Secondary | ICD-10-CM

## 2021-09-08 DIAGNOSIS — I10 Essential (primary) hypertension: Secondary | ICD-10-CM

## 2021-09-08 DIAGNOSIS — J189 Pneumonia, unspecified organism: Secondary | ICD-10-CM

## 2021-09-08 DIAGNOSIS — Z6841 Body Mass Index (BMI) 40.0 and over, adult: Secondary | ICD-10-CM

## 2021-09-08 DIAGNOSIS — F339 Major depressive disorder, recurrent, unspecified: Secondary | ICD-10-CM

## 2021-09-08 DIAGNOSIS — G894 Chronic pain syndrome: Secondary | ICD-10-CM

## 2021-09-08 LAB — POCT GLYCOSYLATED HEMOGLOBIN (HGB A1C): Hemoglobin A1C: 8 % — AB (ref 4.0–5.6)

## 2021-09-08 MED ORDER — TESTOSTERONE CYPIONATE 200 MG/ML IM SOLN: 200.0000 mg | INTRAMUSCULAR | 1 refills | Status: AC

## 2021-09-08 MED ORDER — VALSARTAN-HYDROCHLOROTHIAZIDE 160-25 MG PO TABS: 1.0000 | ORAL_TABLET | Freq: Every day | ORAL | 0 refills | Status: DC

## 2021-09-08 MED ORDER — "INSULIN SYRINGE 30G X 1/2"" 0.5 ML MISC": 0 refills | Status: AC

## 2021-09-08 NOTE — Progress Notes (Signed)
Patient is here for hospital f-up . Patient said he has a lot going on since he was last seen.  Patient said he has pneumonia and is taking medication for

## 2021-09-10 ENCOUNTER — Other Ambulatory Visit: Payer: Self-pay | Admitting: Family Medicine

## 2021-09-10 ENCOUNTER — Encounter: Payer: Self-pay | Admitting: Family Medicine

## 2021-09-10 DIAGNOSIS — E1169 Type 2 diabetes mellitus with other specified complication: Secondary | ICD-10-CM

## 2021-09-10 MED ORDER — INSULIN DETEMIR 100 UNIT/ML ~~LOC~~ SOLN: 15.0000 [IU] | Freq: Every day | SUBCUTANEOUS | 0 refills | Status: DC

## 2021-09-10 NOTE — Telephone Encounter (Signed)
Medication Refill - Medication: insulin detemir (LEVEMIR) 100 UNIT/ML injection  Has the patient contacted their pharmacy? Yes.    (Agent: If yes, when and what did the pharmacy advise?) call provider  Preferred Pharmacy (with phone number or street name):  [id number] - [place], [place] - [address] Phone:  [phone]  Fax:  [phone]     Has the patient been seen for an appointment in the last year OR does the patient have an upcoming appointment? Yes.    Agent: Please be advised that RX refills may take up to 3 business days. We ask that you follow-up with your pharmacy.  Patient is currently out of medication

## 2021-09-10 NOTE — Progress Notes (Signed)
Established Patient Office Visit  Subjective    Patient ID: Aaron Giles, male    DOB: June 26, 1960  Age: 61 y.o. MRN: [id number]  CC:  Chief Complaint  Patient presents with   Follow-up    HFU    HPI Aaron Giles presents for hospital discharge follow up where he was admitted for treatment of pneumonia with respiratory failure. Patient was discharged on yesterday and reports improvement.    Outpatient Encounter Medications as of 09/08/2021  Medication Sig   B-D 3CC LUER-LOK SYR 18GX1-1/2 18G X 1-1/2" 3 ML MISC USE TO ADMINISTER TESTOSTERONE EVERY 14 DAYS.   buPROPion (WELLBUTRIN SR) 150 MG 12 hr tablet Take 1 tablet (150 mg total) by mouth at bedtime.   buPROPion (WELLBUTRIN XL) 300 MG 24 hr tablet TAKE 1 TABLET(300 MG) BY MOUTH DAILY   buPROPion (WELLBUTRIN XL) 300 MG 24 hr tablet Take by mouth.   cyclobenzaprine (FLEXERIL) 10 MG tablet Take by mouth.   HYDROcodone-acetaminophen (NORCO) 10-325 MG tablet Take 1 tablet by mouth 3 (three) times daily as needed.   insulin detemir (LEVEMIR) 100 UNIT/ML injection Inject 0.15 mLs (15 Units total) into the skin daily.   levofloxacin (LEVAQUIN) 750 MG tablet Take 750 mg by mouth daily.   metFORMIN (GLUCOPHAGE) 1000 MG tablet Take by mouth.   methylphenidate (RITALIN) 20 MG tablet Take 20 mg by mouth 2 (two) times daily.   naloxone (NARCAN) nasal spray 4 mg/0.1 mL CALL 911. SPR CONTENTS OF ONE SPRAYER (0.1ML) INTO ONE NOSTRIL. REPEAT IN 2-3 MIN IF SYMPTOMS OF OPIOID EMERGENCY PERSIST, ALTERNATE NOSTRILS   NEEDLE, DISP, 22 G 22G X 1" MISC 1 Stick by Does not apply route every 14 (fourteen) days.   NEEDLE, DISP, 30 G 30G X 1" MISC 1 each by Does not apply route every 14 (fourteen) days.   omeprazole (PRILOSEC) 20 MG capsule Take by mouth.   pregabalin (LYRICA) 300 MG capsule Take by mouth.   rosuvastatin (CRESTOR) 20 MG tablet Take 1 tablet (20 mg total) by mouth daily.   sertraline (ZOLOFT) 100 MG tablet Take 1 tablet (100 mg  total) by mouth daily.   Vitamin D, Ergocalciferol, (DRISDOL) 1.25 MG (50000 UNIT) CAPS capsule Take 1 capsule (50,000 Units total) by mouth every 7 (seven) days.   [DISCONTINUED] Insulin Syringe-Needle U-100 (INSULIN SYRINGE .5CC/30GX1/2") 30G X 1/2" 0.5 ML MISC Utilize as recommended daily to BID   [DISCONTINUED] metFORMIN (GLUCOPHAGE) 1000 MG tablet TAKE 1 TABLET(1000 MG) BY MOUTH TWICE DAILY WITH A MEAL   [DISCONTINUED] omeprazole (PRILOSEC) 40 MG capsule Take 1 capsule (40 mg total) by mouth daily.   [DISCONTINUED] pregabalin (LYRICA) 150 MG capsule Take 1 capsule (150 mg total) by mouth 2 (two) times daily.   [DISCONTINUED] sertraline (ZOLOFT) 100 MG tablet Take by mouth.   [DISCONTINUED] testosterone cypionate (DEPOTESTOSTERONE CYPIONATE) 200 MG/ML injection Inject 1 mL (200 mg total) into the muscle every 14 (fourteen) days.   [DISCONTINUED] valsartan-hydrochlorothiazide (DIOVAN-HCT) 160-25 MG tablet Take 1 tablet by mouth daily.   Insulin Syringe-Needle U-100 (INSULIN SYRINGE .5CC/30GX1/2") 30G X 1/2" 0.5 ML MISC Utilize as recommended daily to BID   testosterone cypionate (DEPOTESTOSTERONE CYPIONATE) 200 MG/ML injection Inject 1 mL (200 mg total) into the muscle every 14 (fourteen) days.   valsartan-hydrochlorothiazide (DIOVAN-HCT) 160-25 MG tablet Take 1 tablet by mouth daily.   [DISCONTINUED] albuterol (VENTOLIN HFA) 108 (90 Base) MCG/ACT inhaler INHALE 2 PUFFS INTO THE LUNGS EVERY 6 HOURS AS NEEDED FOR WHEEZING OR SHORTNESS OF BREATH   [  DISCONTINUED] buPROPion (ZYBAN) 150 MG 12 hr tablet Take 150 mg by mouth daily.   [DISCONTINUED] busPIRone (BUSPAR) 10 MG tablet TAKE 2 TABLETS(20 MG) BY MOUTH TWICE DAILY   [DISCONTINUED] meloxicam (MOBIC) 15 MG tablet TAKE 1/2 TO 1 TABLET(7.5 TO 15 MG) BY MOUTH DAILY AS NEEDED FOR PAIN   [DISCONTINUED] nabumetone (RELAFEN) 750 MG tablet TAKE 1 TABLET(750 MG) BY MOUTH TWICE DAILY AS NEEDED   [DISCONTINUED] atorvastatin (LIPITOR) tablet     [DISCONTINUED] buPROPion (WELLBUTRIN XL) 24 hr tablet    [DISCONTINUED] busPIRone (BUSPAR) tablet    [DISCONTINUED] butalbital-acetaminophen-caffeine (FIORICET) 50-325-40 MG per tablet    [DISCONTINUED] cyclobenzaprine (FLEXERIL) tablet    [DISCONTINUED] dextrose (GLUTOSE) oral gel 40%    [DISCONTINUED] dextrose 10 % infusion    [DISCONTINUED] DSS CAPS    [DISCONTINUED] enoxaparin (LOVENOX) injection    [DISCONTINUED] GENERIC EXTERNAL MEDICATION    [DISCONTINUED] GENERIC EXTERNAL MEDICATION    [DISCONTINUED] GENERIC EXTERNAL MEDICATION    [DISCONTINUED] HYDROcodone-acetaminophen (NORCO) 10-325 MG per tablet    [DISCONTINUED] HYDROcodone-acetaminophen (NORCO/VICODIN) 5-325 MG per tablet    [DISCONTINUED] insulin glargine (LANTUS) injection    [DISCONTINUED] insulin lispro (HUMALOG) injection    [DISCONTINUED] melatonin tablet    [DISCONTINUED] methylphenidate (RITALIN) tablet    [DISCONTINUED] nicotine (NICODERM CQ - dosed in mg/24 hours) patch    [DISCONTINUED] ondansetron (ZOFRAN) injection    [DISCONTINUED] pantoprazole (PROTONIX) EC tablet    [DISCONTINUED] pregabalin (LYRICA) capsule    [DISCONTINUED] sertraline (ZOLOFT) tablet    No facility-administered encounter medications on file as of 09/08/2021.    Past Medical History:  Diagnosis Date   ADHD    Anxiety    Arthritis    Carpal tunnel syndrome on right    Cellulitis    Cervical spondylosis    Chronic hepatitis C (HCC)    Chronic pain syndrome    COPD (chronic obstructive pulmonary disease) (HCC)    Depression    Dyslipidemia associated with type 2 diabetes mellitus (HCC)    GERD (gastroesophageal reflux disease)    History of colonic polyps    History of umbilical hernia    Hypertension    Hypogonadotropic hypogonadism (HCC)    Lumbar spondylosis    Neuromuscular disorder (HCC)    OSA (obstructive sleep apnea)    Peripheral autonomic neuropathy    Peripheral edema    Type 2 diabetes mellitus with diabetic  neuropathy (HCC)    Venous stasis     Past Surgical History:  Procedure Laterality Date   APPENDECTOMY     groin abscess Right 2017   POSTERIOR LUMBAR FUSION     L3-4 fusion   removal of cyst from testicle Right    SMALL INTESTINE SURGERY     UMBILICAL HERNIA REPAIR      Family History  Adopted: Yes    Social History   Socioeconomic History   Marital status: Divorced    Spouse name: Not on file   Number of children: Not on file   Years of education: Not on file   Highest education level: Not on file  Occupational History   Not on file  Tobacco Use   Smoking status: Every Day    Packs/day: 0.25    Years: 45.00    Total pack years: 11.25    Types: Cigarettes   Smokeless tobacco: Never  Vaping Use   Vaping Use: Never used  Substance and Sexual Activity   Alcohol use: Not Currently   Drug use: Never   Sexual activity:  Not Currently  Other Topics Concern   Not on file  Social History Narrative   Not on file   Social Determinants of Health   Financial Resource Strain: High Risk (05/28/2021)   Overall Financial Resource Strain (CARDIA)    Difficulty of Paying Living Expenses: Hard  Food Insecurity: Food Insecurity Present (05/28/2021)   Hunger Vital Sign    Worried About Running Out of Food in the Last Year: Often true    Ran Out of Food in the Last Year: Often true  Transportation Needs: No Transportation Needs (05/28/2021)   PRAPARE - Administrator, Civil Service (Medical): No    Lack of Transportation (Non-Medical): No  Physical Activity: Inactive (05/28/2021)   Exercise Vital Sign    Days of Exercise per Week: 0 days    Minutes of Exercise per Session: 0 min  Stress: Stress Concern Present (05/28/2021)   [employer] of Occupational Health - Occupational Stress Questionnaire    Feeling of Stress : Very much  Social Connections: Socially Isolated (05/28/2021)   Social Connection and Isolation Panel [NHANES]    Frequency of Communication with  Friends and Family: Never    Frequency of Social Gatherings with Friends and Family: Never    Attends Religious Services: Never    Database administrator or Organizations: No    Attends Banker Meetings: Never    Marital Status: Divorced  Catering manager Violence: Not At Risk (05/28/2021)   Humiliation, Afraid, Rape, and Kick questionnaire    Fear of Current or Ex-Partner: No    Emotionally Abused: No    Physically Abused: No    Sexually Abused: No    Review of Systems  Respiratory:  Negative for shortness of breath.   Cardiovascular:  Negative for chest pain.  Psychiatric/Behavioral:  Negative for suicidal ideas.   All other systems reviewed and are negative.       Objective    BP (!) 142/75   Pulse 93   Temp 98.1 F (36.7 C) (Oral)   Resp 18   Wt (!) 303 lb 9.6 oz (137.7 kg)   SpO2 90%   BMI 41.18 kg/m   Physical Exam Vitals and nursing note reviewed.  Constitutional:      General: He is not in acute distress.    Appearance: He is obese.  Cardiovascular:     Rate and Rhythm: Normal rate and regular rhythm.  Pulmonary:     Effort: Pulmonary effort is normal.     Breath sounds: Normal breath sounds.  Abdominal:     Palpations: Abdomen is soft.     Tenderness: There is no abdominal tenderness.  Neurological:     General: No focal deficit present.     Mental Status: He is alert and oriented to person, place, and time.  Psychiatric:        Mood and Affect: Mood is anxious.        Speech: Speech normal.        Behavior: Behavior normal.        Thought Content: Thought content normal.         Assessment & Plan:   1. Type 2 diabetes mellitus with other specified complication, with long-term current use of insulin (HCC) A1c above goal. Discussed compliance. Continue present management and monitor - POCT glycosylated hemoglobin (Hb A1C)  2. Essential hypertension Continue to monitor with present management - valsartan-hydrochlorothiazide  (DIOVAN-HCT) 160-25 MG tablet; Take 1 tablet by mouth daily.  Dispense: 90 tablet; Refill: 0  3. Pneumonia due to infectious organism, unspecified laterality, unspecified part of lung Improved.  4. Class 3 severe obesity due to excess calories with serious comorbidity and body mass index (BMI) of 40.0 to 44.9 in adult [facility]) Discussed dietary and activity options. continue  5. Low testosterone Meds refilled.  - testosterone cypionate (DEPOTESTOSTERONE CYPIONATE) 200 MG/ML injection; Inject 1 mL (200 mg total) into the muscle every 14 (fourteen) days.  Dispense: 10 mL; Refill: 1  6. Hospital discharge follow-up   Return in about 3 weeks (around 09/29/2021) for follow up.   Tommie Raymond, MD

## 2021-09-10 NOTE — Telephone Encounter (Signed)
Requested Prescriptions  Pending Prescriptions Disp Refills  . insulin detemir (LEVEMIR) 100 UNIT/ML injection 10 mL 0    Sig: Inject 0.15 mLs (15 Units total) into the skin daily.     Endocrinology:  Diabetes - Insulins Failed - 09/10/2021  2:28 PM      Failed - HBA1C is between 0 and 7.9 and within 180 days    Hemoglobin A1C  Date Value Ref Range Status  09/08/2021 8.0 (A) 4.0 - 5.6 % Final   HbA1c, POC (controlled diabetic range)  Date Value Ref Range Status  10/05/2020 12.2 (A) 0.0 - 7.0 % Final         Passed - Valid encounter within last 6 months    Recent Outpatient Visits          2 days ago Type 2 diabetes mellitus with other specified complication, with long-term current use of insulin [facility])   Primary Care at [facility], MD   9 months ago Primary hypertension   Primary Care at [facility], Lauris Poag, MD   11 months ago Type 2 diabetes mellitus with other specified complication, with long-term current use of insulin [facility])   [facility], MD   1 year ago Type 2 diabetes mellitus without complication, without long-term current use of insulin [facility])   Primary Care at [facility], Kandee Keen, MD   1 year ago Type 2 diabetes mellitus without complication, without long-term current use of insulin [facility])   Primary Care at [facility], Kandee Keen, MD      Future Appointments            In 3 weeks Georganna Skeans, MD Primary Care at [facility]

## 2021-09-20 ENCOUNTER — Telehealth: Payer: Self-pay | Admitting: Family Medicine

## 2021-09-27 ENCOUNTER — Ambulatory Visit
Admission: EM | Admit: 2021-09-27 | Discharge: 2021-09-27 | Disposition: A | Discharge status: Home / Self Care | Payer: Medicare Other | Attending: Physician Assistant | Admitting: Physician Assistant

## 2021-09-27 ENCOUNTER — Ambulatory Visit (INDEPENDENT_AMBULATORY_CARE_PROVIDER_SITE_OTHER): Payer: Medicare Other

## 2021-09-27 ENCOUNTER — Telehealth: Payer: Self-pay | Admitting: Family Medicine

## 2021-09-27 ENCOUNTER — Encounter: Payer: Self-pay | Admitting: Physician Assistant

## 2021-09-27 DIAGNOSIS — J189 Pneumonia, unspecified organism: Secondary | ICD-10-CM | POA: Diagnosis not present

## 2021-09-27 DIAGNOSIS — R079 Chest pain, unspecified: Secondary | ICD-10-CM | POA: Diagnosis not present

## 2021-09-27 NOTE — ED Triage Notes (Signed)
Pt reports difficulty getting pain meds at the clinic due to recent hospitalization needs clearance to get prescriptions

## 2021-09-27 NOTE — ED Provider Notes (Signed)
[facility]    CSN: [id number] Arrival date & time: 09/27/21  1449      History   Chief Complaint No chief complaint on file.   HPI Aaron Giles is a 61 y.o. male.   Patient here today for follow up from recent hospital admission for pneumonia among other diagnoses. His pain medicine provider requested lung exam, cxr to clear to resume narcotic pain medication for chronic pain. He denies any continued shortness of breath, cough, etc. He has not had fever. He has not taken blood pressure medication today as he has not been home.   The history is provided by the patient.    Past Medical History:  Diagnosis Date   ADHD    Anxiety    Arthritis    Carpal tunnel syndrome on right    Cellulitis    Cervical spondylosis    Chronic hepatitis C (HCC)    Chronic pain syndrome    COPD (chronic obstructive pulmonary disease) (HCC)    Depression    Dyslipidemia associated with type 2 diabetes mellitus (HCC)    GERD (gastroesophageal reflux disease)    History of colonic polyps    History of umbilical hernia    Hypertension    Hypogonadotropic hypogonadism (HCC)    Lumbar spondylosis    Neuromuscular disorder (HCC)    OSA (obstructive sleep apnea)    Peripheral autonomic neuropathy    Peripheral edema    Type 2 diabetes mellitus with diabetic neuropathy (HCC)    Venous stasis     Patient Active Problem List   Diagnosis Date Noted   Chronic intermittent hypoxia with obstructive sleep apnea 03/14/2019   Screening for malignant neoplasm of prostate 03/14/2019   Low testosterone 03/14/2019   Occipital headache 01/28/2019   Myofascial pain syndrome 01/28/2019   Post laminectomy syndrome 01/28/2019   Chronic pain syndrome 01/28/2019   Venous stasis    Hypertension    Diabetes mellitus without complication (HCC)    Herniated nucleus pulposus, L5-S1 03/28/2018   Ruptured lumbar disc 03/28/2018   Lumbar radiculopathy, chronic 03/28/2018   Long term (current)  use of opiate analgesic 07/19/2017   Hypogonadotropic hypogonadism (HCC) 04/03/2017   Cervical spondylosis 02/01/2017   Lumbar spondylosis 10/19/2016   Diverticulosis of colon 08/11/2016   Hardening of the aorta (main artery of the heart) (HCC) 08/11/2016   Hx of colonic polyp 08/11/2016   Umbilical hernia 08/11/2016   Obesity, unspecified 07/19/2016   Type 2 diabetes mellitus with other specified complication (HCC) 07/19/2016   Vitamin D deficiency 04/27/2016   Chronic hepatitis C (HCC) 03/04/2016   Chronic venous hypertension (idiopathic) without complications of left lower extremity 03/04/2016   COPD (chronic obstructive pulmonary disease) (HCC) 03/04/2016   Hx of spinal fusion 03/04/2016   Nicotine dependence 03/04/2016   Peripheral autonomic neuropathy 03/04/2016   Personal history of other diseases of the digestive system 03/04/2016    Past Surgical History:  Procedure Laterality Date   APPENDECTOMY     groin abscess Right 2017   POSTERIOR LUMBAR FUSION     L3-4 fusion   removal of cyst from testicle Right    SMALL INTESTINE SURGERY     UMBILICAL HERNIA REPAIR         Home Medications    Prior to Admission medications   Medication Sig Start Date End Date Taking? Authorizing Provider  B-D 3CC LUER-LOK SYR 18GX1-1/2 18G X 1-1/2" 3 ML MISC USE TO ADMINISTER TESTOSTERONE EVERY 14 DAYS. 09/18/19  [provider]  buPROPion (WELLBUTRIN SR) 150 MG 12 hr tablet Take 1 tablet (150 mg total) by mouth at bedtime. 03/20/20   Arvilla Market, MD  buPROPion (WELLBUTRIN XL) 300 MG 24 hr tablet TAKE 1 TABLET(300 MG) BY MOUTH DAILY 12/23/20   Georganna Skeans, MD  buPROPion (WELLBUTRIN XL) 300 MG 24 hr tablet Take by mouth. 01/09/20   [provider]  cyclobenzaprine (FLEXERIL) 10 MG tablet Take by mouth. 03/20/19   [provider]  HYDROcodone-acetaminophen (NORCO) 10-325 MG tablet Take 1 tablet by mouth 3 (three) times daily as needed. 11/13/19    [provider]  insulin detemir (LEVEMIR) 100 UNIT/ML injection Inject 0.15 mLs (15 Units total) into the skin daily. 09/10/21   Georganna Skeans, MD  Insulin Syringe-Needle U-100 (INSULIN SYRINGE .5CC/30GX1/2") 30G X 1/2" 0.5 ML MISC Utilize as recommended daily to BID 09/08/21   Georganna Skeans, MD  levofloxacin (LEVAQUIN) 750 MG tablet Take 750 mg by mouth daily. 09/06/21   [provider]  metFORMIN (GLUCOPHAGE) 1000 MG tablet Take by mouth. 09/17/19   [provider]  methylphenidate (RITALIN) 20 MG tablet Take 20 mg by mouth 2 (two) times daily. 08/30/21   [provider]  naloxone (NARCAN) nasal spray 4 mg/0.1 mL CALL 911. SPR CONTENTS OF ONE SPRAYER (0.1ML) INTO ONE NOSTRIL. REPEAT IN 2-3 MIN IF SYMPTOMS OF OPIOID EMERGENCY PERSIST, ALTERNATE NOSTRILS 04/26/21   [provider]  NEEDLE, DISP, 22 G 22G X 1" MISC 1 Stick by Does not apply route every 14 (fourteen) days. 06/01/20   Arvilla Market, MD  NEEDLE, DISP, 30 G 30G X 1" MISC 1 each by Does not apply route every 14 (fourteen) days. 01/09/20   Mayers, Cari S, PA-C  omeprazole (PRILOSEC) 20 MG capsule Take by mouth. 12/07/20   [provider]  pregabalin (LYRICA) 300 MG capsule Take by mouth. 11/27/19   [provider]  rosuvastatin (CRESTOR) 20 MG tablet Take 1 tablet (20 mg total) by mouth daily. 08/05/21   Georganna Skeans, MD  sertraline (ZOLOFT) 100 MG tablet Take 1 tablet (100 mg total) by mouth daily. 09/24/20   Rema Fendt, NP  testosterone cypionate (DEPOTESTOSTERONE CYPIONATE) 200 MG/ML injection Inject 1 mL (200 mg total) into the muscle every 14 (fourteen) days. 09/08/21   Georganna Skeans, MD  valsartan-hydrochlorothiazide (DIOVAN-HCT) 160-25 MG tablet Take 1 tablet by mouth daily. 09/08/21   Georganna Skeans, MD  Vitamin D, Ergocalciferol, (DRISDOL) 1.25 MG (50000 UNIT) CAPS capsule Take 1 capsule (50,000 Units total) by mouth every 7 (seven) days. 02/03/20   Mayers, Kasandra Knudsen, PA-C    Family History Family History  Adopted: Yes    Social History Social History   Tobacco Use   Smoking status: Every Day    Packs/day: 0.25    Years: 45.00    Total pack years: 11.25    Types: Cigarettes   Smokeless tobacco: Never  Vaping Use   Vaping Use: Never used  Substance Use Topics   Alcohol use: Not Currently   Drug use: Never     Allergies   Patient has no known allergies.   Review of Systems Review of Systems  Constitutional:  Negative for chills and fever.  Eyes:  Negative for discharge and redness.  Respiratory:  Negative for cough and shortness of breath.   Gastrointestinal:  Negative for nausea and vomiting.  Neurological:  Negative for numbness.     Physical Exam Triage Vital Signs ED Triage  Vitals [09/27/21 1505]  Enc Vitals Group     BP (!) 203/93     Pulse Rate 100     Resp 18     Temp 97.7 F (36.5 C)     Temp src      SpO2 (!) 89 %     Weight      Height      Head Circumference      Peak Flow      Pain Score      Pain Loc      Pain Edu?      Excl. in GC?    No data found.  Updated Vital Signs BP (!) 174/95   Pulse 100   Temp 97.7 F (36.5 C)   Resp 18   SpO2 93%      Physical Exam Vitals and nursing note reviewed.  Constitutional:      General: He is not in acute distress.    Appearance: Normal appearance. He is not ill-appearing.  HENT:     Head: Normocephalic and atraumatic.  Eyes:     Conjunctiva/sclera: Conjunctivae normal.  Cardiovascular:     Rate and Rhythm: Normal rate and regular rhythm.  Pulmonary:     Effort: Pulmonary effort is normal. No respiratory distress.     Breath sounds: Normal breath sounds. No wheezing, rhonchi or rales.  Neurological:     Mental Status: He is alert.  Psychiatric:        Mood and Affect: Mood normal.        Behavior: Behavior normal.        Thought Content: Thought content normal.      UC Treatments / Results  Labs (all labs ordered are listed, but only  abnormal results are displayed) Labs Reviewed - No data to display  EKG   Radiology DG Chest 2 View  Result Date: 09/27/2021 CLINICAL DATA:  Follow-up EXAM: CHEST - 2 VIEW COMPARISON:  Chest CT dated September 06, 2021 FINDINGS: The heart size and mediastinal contours are within normal limits. Linear opacity of the left lower lung which likely correlates with previously seen left lower lobe ground-glass opacity. Old right-sided rib fractures. IMPRESSION: Bandlike opacity of the left lower lung which likely correlates with left lower lobe consolidation seen on prior CT, likely sequela of prior infection or aspiration. Recommend follow-up chest x-ray in 3-4 weeks to ensure resolution. Electronically Signed   By: Allegra Lai M.D.   On: 09/27/2021 15:19    Procedures Procedures (including critical care time)  Medications Ordered in UC Medications - No data to display  Initial Impression / Assessment and Plan / UC Course  I have reviewed the triage vital signs and the nursing notes.  Pertinent labs & imaging results that were available during my care of the patient were reviewed by me and considered in my medical decision making (see chart for details).    Advised patient that chest xray continues to show some consolidation- will print today's note to provide to patient to give to pain medicine provider. I personally discussed what was needed with Dr. Metta Clines who would seem satisfied with this. Recommend patient report to the ED with any worsening.   Final Clinical Impressions(s) / UC Diagnoses   Final diagnoses:  Pneumonia due to infectious organism, unspecified laterality, unspecified part of lung   Discharge Instructions   None    ED Prescriptions   None    PDMP not reviewed this encounter.   Tomi Bamberger,  PA-C 09/27/21 1536

## 2021-09-27 NOTE — Telephone Encounter (Signed)
Pt is calling to schedule an appt with Dr. Andrey Campanile. Pt states that he has been off of his pain meds for 4 days. Pt states that pain management unable to refill medication until they receive clearance for his lungs. First available appt is 10/04/21. Please advise CB- [phone]

## 2021-09-28 NOTE — Telephone Encounter (Signed)
I have attempted without success to contact this patient by phone to I left a message on answering machine.

## 2021-09-28 NOTE — Telephone Encounter (Signed)
ca

## 2021-10-04 ENCOUNTER — Ambulatory Visit: Payer: Medicare Other | Admitting: Family Medicine

## 2021-10-24 ENCOUNTER — Other Ambulatory Visit: Payer: Self-pay | Admitting: Family Medicine

## 2021-10-24 DIAGNOSIS — E1169 Type 2 diabetes mellitus with other specified complication: Secondary | ICD-10-CM

## 2021-12-15 ENCOUNTER — Other Ambulatory Visit: Payer: Self-pay | Admitting: Family Medicine

## 2021-12-15 DIAGNOSIS — I10 Essential (primary) hypertension: Secondary | ICD-10-CM

## 2021-12-16 NOTE — Telephone Encounter (Signed)
Requested medications are due for refill today.  yes  Requested medications are on the active medications list.  yes  Last refill. 09/08/2021 #90 0 rf  Future visit scheduled.   no  Notes to clinic.  Labs are expired    Requested Prescriptions  Pending Prescriptions Disp Refills   valsartan-hydrochlorothiazide (DIOVAN-HCT) 160-25 MG tablet [Pharmacy Med Name: VALSARTAN/HCTZ 160MG/25MG TABLETS] 90 tablet 0    Sig: Take 1 tablet by mouth daily.     Cardiovascular: ARB + Diuretic Combos Failed - 12/15/2021  7:19 PM      Failed - K in normal range and within 180 days    Potassium  Date Value Ref Range Status  06/01/2020 4.3 3.5 - 5.2 mmol/L Final         Failed - Na in normal range and within 180 days    Sodium  Date Value Ref Range Status  06/01/2020 139 134 - 144 mmol/L Final         Failed - Cr in normal range and within 180 days    Creatinine, Ser  Date Value Ref Range Status  06/01/2020 0.94 0.76 - 1.27 mg/dL Final         Failed - eGFR is 10 or above and within 180 days    GFR calc Af Amer  Date Value Ref Range Status  01/30/2020 89 >59 mL/min/1.73 Final    Comment:    **In accordance with recommendations from the NKF-ASN Task force,**   [facility] is in the process of updating its eGFR calculation to the   2021 CKD-EPI creatinine equation that estimates kidney function   without a race variable.    GFR calc non Af Amer  Date Value Ref Range Status  01/30/2020 77 >59 mL/min/1.73 Final   eGFR  Date Value Ref Range Status  06/01/2020 94 >59 mL/min/1.73 Final         Failed - Last BP in normal range    BP Readings from Last 1 Encounters:  09/27/21 (!) 174/95         Passed - Patient is not pregnant      Passed - Valid encounter within last 6 months    Recent Outpatient Visits           3 months ago Type 2 diabetes mellitus with other specified complication, with long-term current use of insulin ([place])   Primary Care at [facility], MD    1 year ago Primary hypertension   Primary Care at [facility], Clyde Canterbury, MD   1 year ago Type 2 diabetes mellitus with other specified complication, with long-term current use of insulin [facility])   Primary Care at [facility], MD   1 year ago Type 2 diabetes mellitus without complication, without long-term current use of insulin [facility])   Primary Care at [facility], Bayard Beaver, MD   1 year ago Type 2 diabetes mellitus without complication, without long-term current use of insulin [facility])   Primary Care at [facility], Bayard Beaver, MD

## 2021-12-22 IMAGING — MR MR LUMBAR SPINE WO/W CM
4 of 7 series · 27 of 48 positions shown · IV contrast (multihance)
Comparison: None available

CLINICAL DATA: Low back pain. History of lumbar fusion in 6496

EXAM:
MRI LUMBAR SPINE WITHOUT AND WITH CONTRAST
TECHNIQUE: Multiplanar and multiecho pulse sequences of the lumbar spine were
obtained without and with intravenous contrast.
CONTRAST:  20mL MULTIHANCE GADOBENATE DIMEGLUMINE 529 MG/ML IV SOLN

[Series 3: T1 · sagittal · 4.0mm · 0.55mm/px · 3 of 13 slices shown (1 of 2)]
[im 1/13]
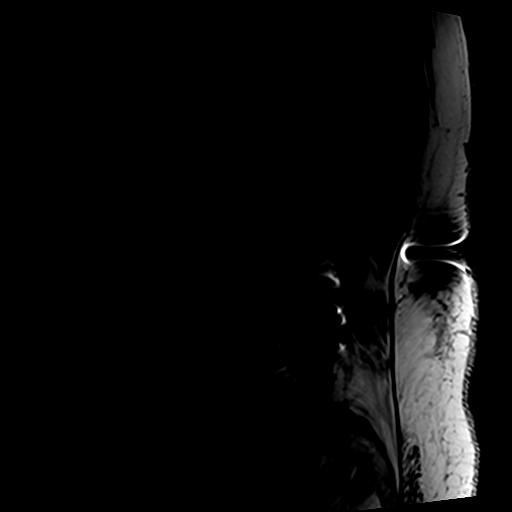
[im 7/13]
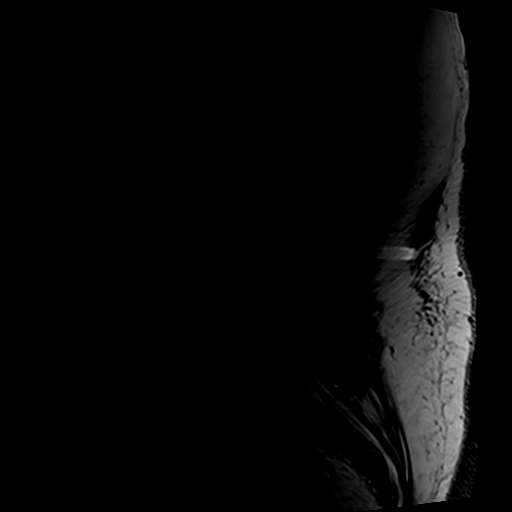
[im 13/13]
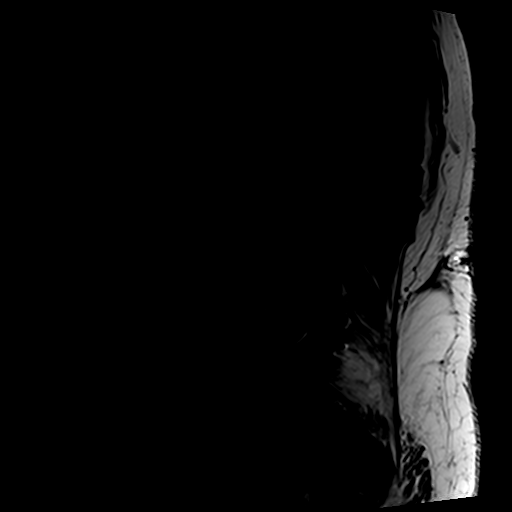

[Series 5: T2 · axial · 4.0mm · 0.70mm/px · z∈[-69,+129]mm · 11 of 36 slices shown (1 of 2)]
[im 1/36]
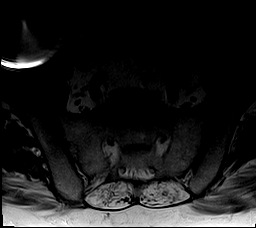
[im 4/36]
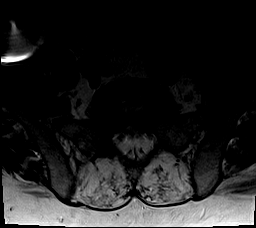
[im 8/36]
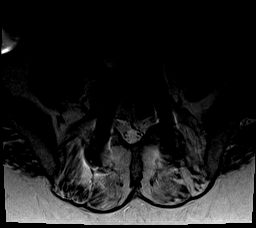
[im 11/36]
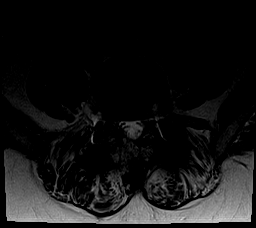
[im 15/36]
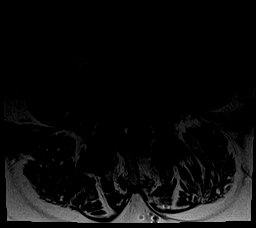
[im 18/36]
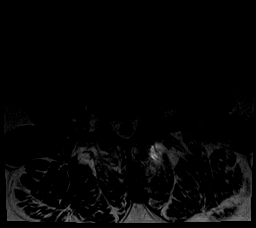
[im 22/36]
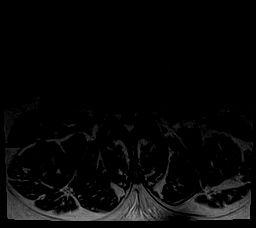
[im 25/36]
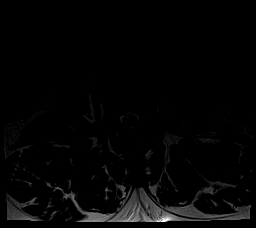
[im 29/36]
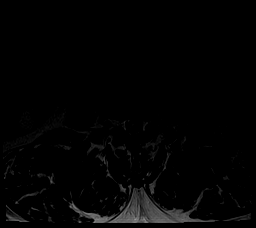
[im 32/36]
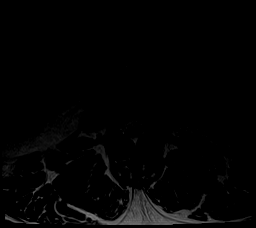
[im 36/36]
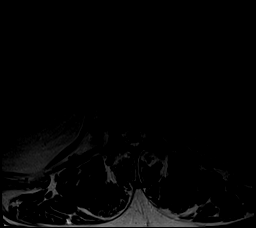

[Series 6: T1 · axial · 4.0mm · 0.35mm/px · z∈[-69,+109]mm · 9 of 36 slices shown (2 of 2)]
[im 1/36]
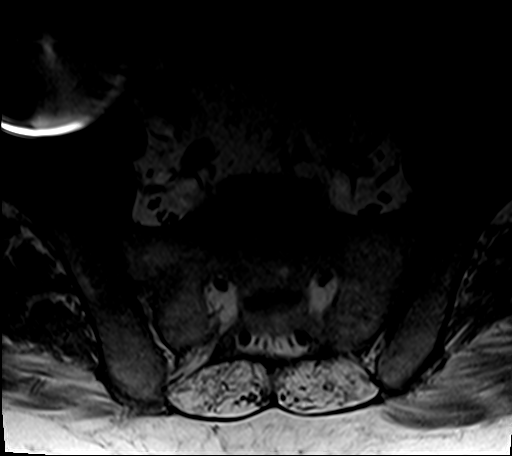
[im 4/36]
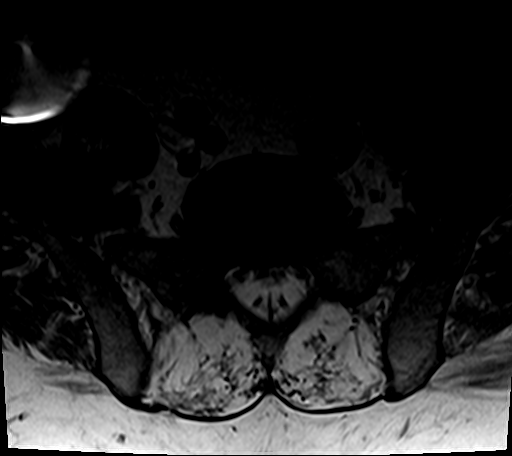
[im 8/36]
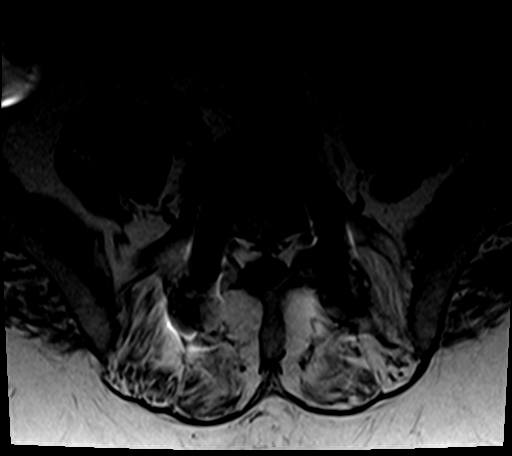
[im 11/36]
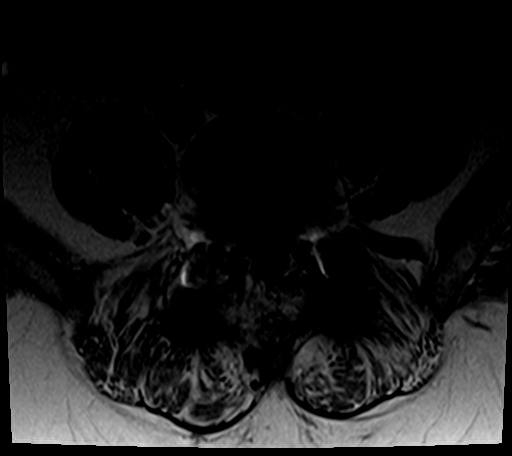
[im 15/36]
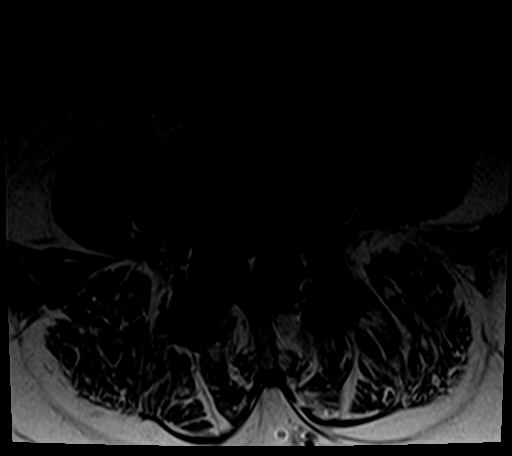
[im 18/36]
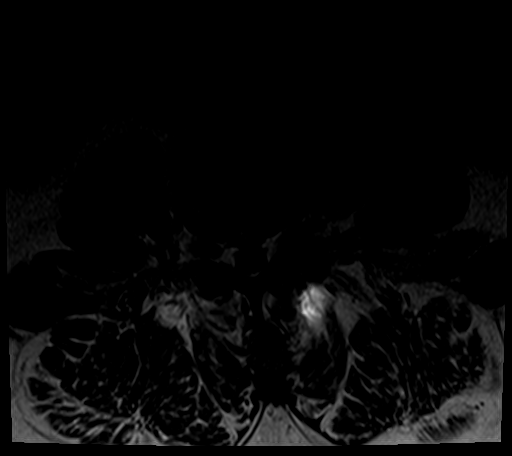
[im 22/36]
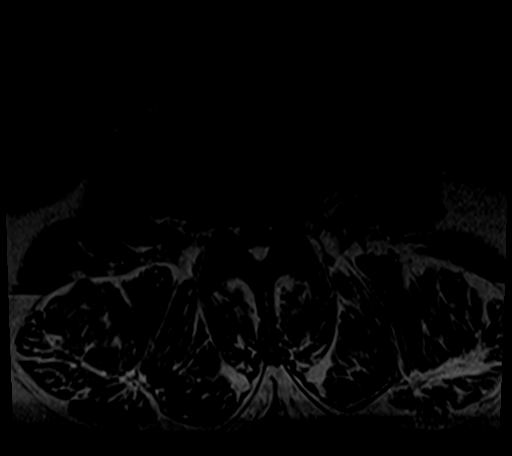
[im 25/36]
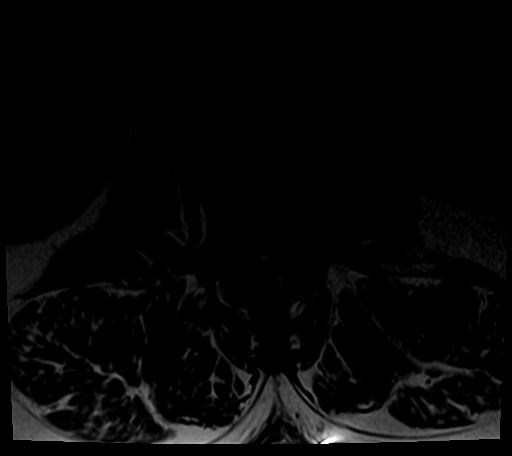
[im 32/36]
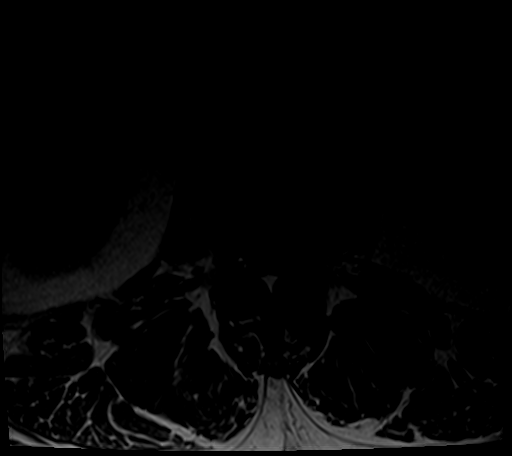

[Series 7: T2 · sagittal · 4.0mm · 0.55mm/px · 4 of 13 slices shown (2 of 2)]
[im 1/13]
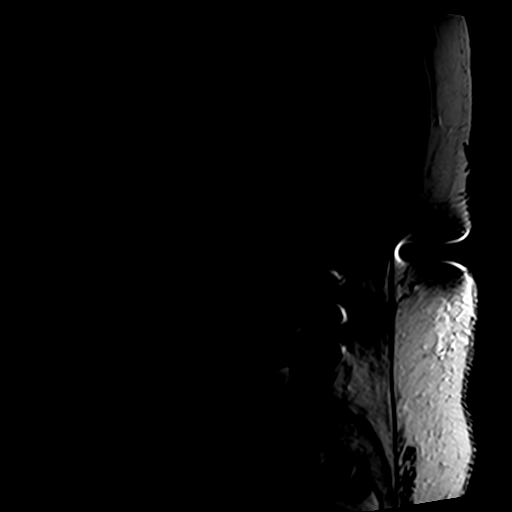
[im 5/13]
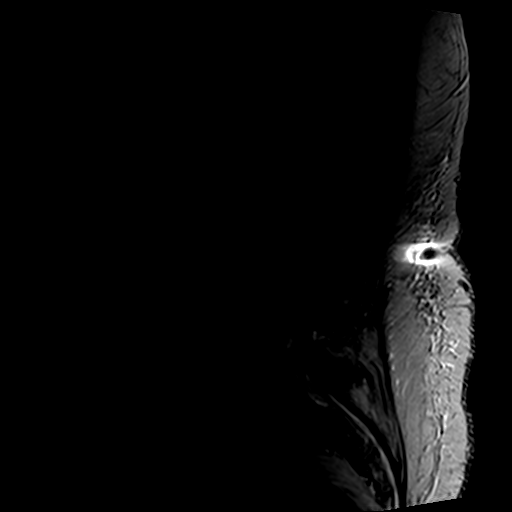
[im 9/13]
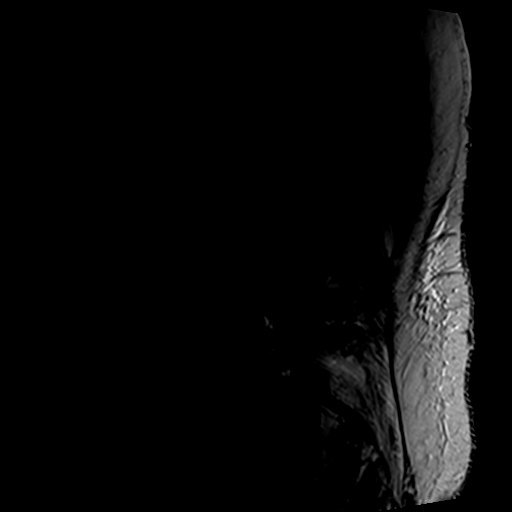
[im 13/13]
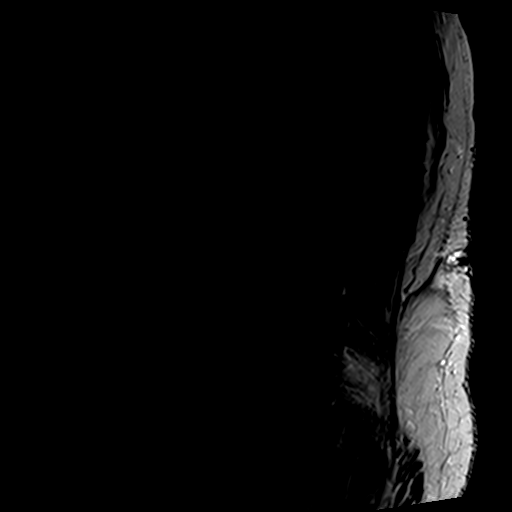

[27 of 48 positions shown; findings below may reference images not displayed]

FINDINGS: Segmentation:  Standard.

Alignment: 5 mm grade 1 anterolisthesis L4 on L5. 4 mm
retrolisthesis L2 on L3. 5 mm retrolisthesis L3 on L4.

Vertebrae: No fracture, evidence of discitis, or bone lesion.
Posterior fusion of L4-5. Diffuse intrinsic canal narrowing on the
basis of congenitally short pedicles.

Conus medullaris and cauda equina: Conus extends to the L1 level.
Conus and cauda equina appear normal.

Paraspinal and other soft tissues: Cortically based T2 hyperintense
lesionswithin the right kidney, incompletely characterized, but most
likely represent cysts.

Disc levels:

T12-L1: Minimal disc bulge with small left foraminal protrusion. No
foraminal or canal stenosis.

L1-L2: Mild diffuse disc bulge on an intrinsically narrowed canal
resulting in mild canal stenosis and mild bilateral foraminal
stenosis.

L2-L3: Mild diffuse disc bulge and mild bilateral facet arthrosis in
association with an intrinsically narrowed canal results in moderate
canal stenosis, moderate left foraminal stenosis, and mild right
foraminal stenosis.

L3-L4: Retrolisthesis with mild diffuse disc bulge and bilateral
facet arthropathy in association with an intrinsically narrowed
canal resulting in moderate to severe canal stenosis, severe left
foraminal stenosis, and moderate right foraminal stenosis.

L4-L5: Prior fusion and posterior decompression. Grade 1
anterolisthesis. No canal stenosis. Mild right foraminal stenosis.
Left foramen appears patent. No abnormal postcontrast enhancement.

L5-S1: Unremarkable disc. Epidural lipomatosis narrows the canal.
Bilateral facet arthrosis. No foraminal stenosis.
IMPRESSION: 1. Prior L4-5 posterior fusion and decompression without residual
canal stenosis at this level. The right neural foramen is mildly
narrowed.
2. Congenitally narrowed canal with superimposed multilevel
degenerative changes, most severe at L3-4 where there is
moderate-to-severe canal stenosis, severe left foraminal stenosis,
and moderate right foraminal stenosis.
3. Moderate canal stenosis and moderate left foraminal stenosis at
L2-3.
4. L5-S1 epidural lipomatosis.

## 2022-07-07 ENCOUNTER — Telehealth: Payer: Self-pay | Admitting: Family Medicine

## 2022-07-07 NOTE — Telephone Encounter (Signed)
Called patient to schedule Medicare Annual Wellness Visit (AWV). No voicemail available to leave a message.  Last date of AWV: 04/30/21  Please transfer to [place] to schedule [phone]  .  Thank you ,  Rudell Cobb AWV direct phone # [phone]

## 2022-07-14 ENCOUNTER — Telehealth: Payer: Self-pay | Admitting: Family Medicine

## 2022-07-14 NOTE — Telephone Encounter (Signed)
Called patient to schedule Medicare Annual Wellness Visit (AWV). No voicemail available to leave a message.  Last date of AWV: 05/28/21  Please transfer to Fairley Copher @ [phone]  Thank you ,  Rudell Cobb AWV direct phone # [phone]   Tried calling patient to schedule  phone call would not go through
